# Patient Record
Sex: Male | Born: 1960
Health system: Southern US, Community
[De-identification: ages and names within clinical notes are randomized; demographics above are authoritative.]

## PROBLEM LIST (undated history)

## (undated) DIAGNOSIS — I10 Essential (primary) hypertension: Secondary | ICD-10-CM

## (undated) DIAGNOSIS — I2510 Atherosclerotic heart disease of native coronary artery without angina pectoris: Secondary | ICD-10-CM

## (undated) DIAGNOSIS — Z9989 Dependence on other enabling machines and devices: Secondary | ICD-10-CM

## (undated) DIAGNOSIS — I219 Acute myocardial infarction, unspecified: Secondary | ICD-10-CM

## (undated) DIAGNOSIS — G4733 Obstructive sleep apnea (adult) (pediatric): Secondary | ICD-10-CM

## (undated) DIAGNOSIS — E785 Hyperlipidemia, unspecified: Secondary | ICD-10-CM

## (undated) HISTORY — DX: Dependence on other enabling machines and devices: Z99.89

## (undated) HISTORY — DX: Atherosclerotic heart disease of native coronary artery without angina pectoris: I25.10

## (undated) HISTORY — DX: Acute myocardial infarction, unspecified: I21.9

## (undated) HISTORY — DX: Obstructive sleep apnea (adult) (pediatric): G47.33

## (undated) HISTORY — DX: Hyperlipidemia, unspecified: E78.5

## (undated) HISTORY — DX: Essential (primary) hypertension: I10

---
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
## 2010-01-13 DIAGNOSIS — I219 Acute myocardial infarction, unspecified: Secondary | ICD-10-CM

## 2010-01-13 HISTORY — DX: Acute myocardial infarction, unspecified: I21.9

## 2010-01-20 ENCOUNTER — Inpatient Hospital Stay (HOSPITAL_COMMUNITY): Admission: EM | Admit: 2010-01-20 | Discharge: 2010-01-23 | Payer: Self-pay | Admitting: Emergency Medicine

## 2010-01-20 ENCOUNTER — Ambulatory Visit: Payer: Self-pay | Admitting: Cardiovascular Disease

## 2010-01-20 ENCOUNTER — Encounter (INDEPENDENT_AMBULATORY_CARE_PROVIDER_SITE_OTHER): Payer: Self-pay | Admitting: Cardiology

## 2010-01-20 HISTORY — PX: US ECHOCARDIOGRAPHY: HXRAD669

## 2010-01-20 HISTORY — PX: CARDIAC CATHETERIZATION: SHX172

## 2010-05-18 ENCOUNTER — Ambulatory Visit
Admission: RE | Admit: 2010-05-18 | Discharge: 2010-05-18 | Disposition: A | Discharge status: Home / Self Care | Payer: BC Managed Care – PPO | Source: Ambulatory Visit | Attending: Physician Assistant | Admitting: Physician Assistant

## 2010-05-18 ENCOUNTER — Other Ambulatory Visit: Payer: Self-pay | Admitting: Physician Assistant

## 2010-05-18 DIAGNOSIS — J9859 Other diseases of mediastinum, not elsewhere classified: Secondary | ICD-10-CM

## 2010-05-18 MED ORDER — IOHEXOL 300 MG/ML  SOLN
75.0000 mL | Freq: Once | INTRAMUSCULAR | Status: AC | PRN
  Administered 2010-05-18: 75 mL via INTRAVENOUS

## 2010-05-22 HISTORY — PX: NM MYOCAR PERF WALL MOTION: HXRAD629

## 2010-06-28 LAB — COMPREHENSIVE METABOLIC PANEL
ALT: 39 U/L (ref 0–53)
AST: 38 U/L — ABNORMAL HIGH (ref 0–37)
AST: 85 U/L — ABNORMAL HIGH (ref 0–37)
Albumin: 4.1 g/dL (ref 3.5–5.2)
Alkaline Phosphatase: 73 U/L (ref 39–117)
BUN: 13 mg/dL (ref 6–23)
CO2: 23 mEq/L (ref 19–32)
CO2: 25 mEq/L (ref 19–32)
Chloride: 103 mEq/L (ref 96–112)
Chloride: 104 mEq/L (ref 96–112)
Creatinine, Ser: 0.97 mg/dL (ref 0.4–1.5)
GFR calc Af Amer: >60 mL/min (ref >60)
GFR calc non Af Amer: >60 mL/min (ref >60)
GFR calc non Af Amer: >60 mL/min (ref >60)
Potassium: 4.2 mEq/L (ref 3.5–5.1)
Total Bilirubin: 0.8 mg/dL (ref 0.3–1.2)
Total Bilirubin: 1.5 mg/dL — ABNORMAL HIGH (ref 0.3–1.2)

## 2010-06-28 LAB — GLUCOSE, CAPILLARY
Glucose-Capillary: 100 mg/dL — ABNORMAL HIGH (ref 70–99)
Glucose-Capillary: 103 mg/dL — ABNORMAL HIGH (ref 70–99)
Glucose-Capillary: 104 mg/dL — ABNORMAL HIGH (ref 70–99)
Glucose-Capillary: 104 mg/dL — ABNORMAL HIGH (ref 70–99)
Glucose-Capillary: 104 mg/dL — ABNORMAL HIGH (ref 70–99)
Glucose-Capillary: 113 mg/dL — ABNORMAL HIGH (ref 70–99)
Glucose-Capillary: 115 mg/dL — ABNORMAL HIGH (ref 70–99)
Glucose-Capillary: 128 mg/dL — ABNORMAL HIGH (ref 70–99)
Glucose-Capillary: 82 mg/dL (ref 70–99)
Glucose-Capillary: 97 mg/dL (ref 70–99)

## 2010-06-28 LAB — CARDIAC PANEL(CRET KIN+CKTOT+MB+TROPI)
CK, MB: 15.1 ng/mL (ref 0.3–4.0)
Relative Index: 22.5 — ABNORMAL HIGH (ref 0.0–2.5)
Relative Index: 7.7 — ABNORMAL HIGH (ref 0.0–2.5)
Total CK: 196 U/L (ref 7–232)
Total CK: 264 U/L — ABNORMAL HIGH (ref 7–232)
Total CK: 370 U/L — ABNORMAL HIGH (ref 7–232)
Troponin I: 10.15 ng/mL (ref 0.00–0.06)
Troponin I: 7.9 ng/mL (ref 0.00–0.06)

## 2010-06-28 LAB — CBC
HCT: 48.1 % (ref 39.0–52.0)
HCT: 48.8 % (ref 39.0–52.0)
HCT: 48.9 % (ref 39.0–52.0)
Hemoglobin: 16.6 g/dL (ref 13.0–17.0)
Hemoglobin: 17.3 g/dL — ABNORMAL HIGH (ref 13.0–17.0)
Hemoglobin: 17.7 g/dL — ABNORMAL HIGH (ref 13.0–17.0)
MCH: 32 pg (ref 26.0–34.0)
MCH: 32.8 pg (ref 26.0–34.0)
MCHC: 35.5 g/dL (ref 30.0–36.0)
MCV: 91.6 fL (ref 78.0–100.0)
MCV: 92.4 fL (ref 78.0–100.0)
MCV: 93.2 fL (ref 78.0–100.0)
Platelets: 237 10*3/uL (ref 150–400)
Platelets: 318 10*3/uL (ref 150–400)
RBC: 5.16 MIL/uL (ref 4.22–5.81)
RBC: 5.22 MIL/uL (ref 4.22–5.81)
RBC: 5.47 MIL/uL (ref 4.22–5.81)
WBC: 10.6 10*3/uL — ABNORMAL HIGH (ref 4.0–10.5)
WBC: 15.3 10*3/uL — ABNORMAL HIGH (ref 4.0–10.5)

## 2010-06-28 LAB — BASIC METABOLIC PANEL
BUN: 11 mg/dL (ref 6–23)
CO2: 24 mEq/L (ref 19–32)
Calcium: 8.8 mg/dL (ref 8.4–10.5)
Calcium: 8.9 mg/dL (ref 8.4–10.5)
Creatinine, Ser: 0.99 mg/dL (ref 0.4–1.5)
GFR calc Af Amer: >60 mL/min (ref >60)
GFR calc Af Amer: >60 mL/min (ref >60)
GFR calc non Af Amer: >60 mL/min (ref >60)
Potassium: 4 mEq/L (ref 3.5–5.1)
Sodium: 138 mEq/L (ref 135–145)

## 2010-06-28 LAB — DIFFERENTIAL
Basophils Absolute: 0 10*3/uL (ref 0.0–0.1)
Lymphocytes Relative: 10 % — ABNORMAL LOW (ref 12–46)
Lymphs Abs: 1.5 10*3/uL (ref 0.7–4.0)
Monocytes Absolute: 0.7 10*3/uL (ref 0.1–1.0)
Monocytes Relative: 4 % (ref 3–12)
Neutro Abs: 13 10*3/uL — ABNORMAL HIGH (ref 1.7–7.7)

## 2010-06-28 LAB — POCT CARDIAC MARKERS

## 2010-06-28 LAB — POCT I-STAT, CHEM 8
Calcium, Ion: 0.88 mmol/L — ABNORMAL LOW (ref 1.12–1.32)
Glucose, Bld: 168 mg/dL — ABNORMAL HIGH (ref 70–99)
HCT: 52 % (ref 39.0–52.0)
Hemoglobin: 17.7 g/dL — ABNORMAL HIGH (ref 13.0–17.0)
Potassium: 4.3 mEq/L (ref 3.5–5.1)

## 2010-06-28 LAB — URINALYSIS, ROUTINE W REFLEX MICROSCOPIC
Glucose, UA: NEGATIVE mg/dL
Hgb urine dipstick: NEGATIVE
Protein, ur: NEGATIVE mg/dL

## 2010-06-28 LAB — BRAIN NATRIURETIC PEPTIDE: Pro B Natriuretic peptide (BNP): 55 pg/mL (ref 0.0–100.0)

## 2010-06-28 LAB — PROTIME-INR
INR: 1.05 (ref 0.00–1.49)
Prothrombin Time: 13.9 seconds (ref 11.6–15.2)

## 2010-06-28 LAB — MAGNESIUM: Magnesium: 2 mg/dL (ref 1.5–2.5)

## 2010-06-28 LAB — TROPONIN I: Troponin I: 0.54 ng/mL (ref 0.00–0.06)

## 2010-06-28 LAB — APTT: aPTT: 30 seconds (ref 24–37)

## 2010-06-28 LAB — HEMOCCULT GUIAC POC 1CARD (OFFICE): Fecal Occult Bld: NEGATIVE

## 2010-07-19 ENCOUNTER — Encounter: Payer: Self-pay | Admitting: Pulmonary Disease

## 2010-07-19 ENCOUNTER — Ambulatory Visit (INDEPENDENT_AMBULATORY_CARE_PROVIDER_SITE_OTHER): Payer: BC Managed Care – PPO | Admitting: Pulmonary Disease

## 2010-07-19 VITALS — BP 130/88 | HR 92 | Temp 98.3°F | Ht 72.0 in | Wt 247.8 lb

## 2010-07-19 DIAGNOSIS — R0602 Shortness of breath: Secondary | ICD-10-CM

## 2010-07-19 DIAGNOSIS — R05 Cough: Secondary | ICD-10-CM

## 2010-07-19 NOTE — Patient Instructions (Addendum)
Trial of asmanex 1 puff daily Use albuterol inhaler only as needed Stay on reflux medication twice daily Take ZYRTEC during pollen season

## 2010-07-19 NOTE — Progress Notes (Signed)
  Subjective:    Patient ID: Adam Hunter, male    DOB: [birth year], 50 y.o.   MRN: [id number]  HPI 49/M ,ex smoker with COPD & severe OSA  He c/o dry cough worse in the mornings x 2 months , Got sick in feb when co worker got 'pna' 2 rounds of z-pak Then prednisone x 20 ds& inhaler Now c/o Am cough when he takes CPAP off  He suffered a Mild heart attack in oct' 11, on CPAP since dec'11 (Dr Tresa Endo) - on cpap 17 cm, quit smoking since oct'11 He had a negative stress test  This year - a good prognostic indicator. Spirometry nml  Ct chest has shown centrilobular emphysema in both upper lobes & mild mediastinal Lns that was stable comapred to CT from 01/21/10 s/o benign process.    Review of Systems  Constitutional: Negative for fever, appetite change and unexpected weight change.  HENT: Positive for postnasal drip. Negative for ear pain, sore throat, rhinorrhea, sneezing, trouble swallowing and dental problem.   Eyes: Positive for redness.  Respiratory: Positive for cough and shortness of breath. Negative for wheezing.   Cardiovascular: Positive for chest pain. Negative for palpitations and leg swelling.  Gastrointestinal: Positive for vomiting. Negative for nausea, abdominal pain and diarrhea.  Genitourinary: Negative for dysuria and urgency.  Musculoskeletal: Positive for joint swelling.  Skin: Negative for rash.  Neurological: Positive for headaches. Negative for syncope.  Hematological: Does not bruise/bleed easily.  Psychiatric/Behavioral: Negative for dysphoric mood. The patient is nervous/anxious.        Objective:   Physical Exam Gen. Pleasant, well-nourished, in no distress, normal affect ENT - no lesions, no post nasal drip, class 3 airway Neck: No JVD, no thyromegaly, no carotid bruits Lungs: no use of accessory muscles, no dullness to percussion, clear without rales or rhonchi  Cardiovascular: Rhythm regular, heart sounds  normal, no murmurs or gallops, no peripheral  edema Abdomen: soft and non-tender, no hepatosplenomegaly, BS normal. Musculoskeletal: No deformities, no cyanosis or clubbing Neuro:  alert, non focal         Assessment & Plan:

## 2010-07-22 ENCOUNTER — Encounter: Payer: Self-pay | Admitting: Pulmonary Disease

## 2010-07-22 DIAGNOSIS — R05 Cough: Secondary | ICD-10-CM | POA: Insufficient documentation

## 2010-07-22 DIAGNOSIS — R059 Cough, unspecified: Secondary | ICD-10-CM | POA: Insufficient documentation

## 2010-07-22 NOTE — Assessment & Plan Note (Signed)
Unclear etiology, but common causes include upper airway cough -sinus disease, allergies, GERd - doubt cough variant asthma. Airway obstruction does not seem bad inspite of 30 Py history of smoking Trial of asmanex 1 puff daily Use albuterol inhaler only as needed Stay on reflux medication twice daily Take ZYRTEC during pollen season

## 2010-09-03 ENCOUNTER — Ambulatory Visit: Payer: BC Managed Care – PPO | Admitting: Pulmonary Disease

## 2012-04-09 ENCOUNTER — Emergency Department: Payer: Self-pay | Admitting: Emergency Medicine

## 2012-04-09 LAB — CBC
HCT: 45.2 % (ref 40.0–52.0)
HGB: 15.2 g/dL (ref 13.0–18.0)
MCH: 29.9 pg (ref 26.0–34.0)
MCHC: 33.6 g/dL (ref 32.0–36.0)
Platelet: 250 10*3/uL (ref 150–440)
RDW: 13.9 % (ref 11.5–14.5)
WBC: 9.2 10*3/uL (ref 3.8–10.6)

## 2012-04-09 LAB — CK TOTAL AND CKMB (NOT AT ARMC): CK, Total: 110 U/L (ref 35–232)

## 2012-04-09 LAB — BASIC METABOLIC PANEL
Calcium, Total: 8.8 mg/dL (ref 8.5–10.1)
Chloride: 107 mmol/L (ref 98–107)
Co2: 26 mmol/L (ref 21–32)
Creatinine: 1.03 mg/dL (ref 0.60–1.30)
EGFR (Non-African Amer.): >60
Osmolality: 284 (ref 275–301)
Potassium: 3.5 mmol/L (ref 3.5–5.1)
Sodium: 140 mmol/L (ref 136–145)

## 2012-04-09 LAB — TROPONIN I
Troponin-I: <0.02 ng/mL
Troponin-I: <0.02 ng/mL

## 2012-08-24 IMAGING — CT CT ANGIO CHEST
2 of 6 series · 19 of 36 positions shown · IV contrast (APPLIED)
Comparison: None.

CLINICAL DATA: Chest pain with elevated D-dimer.

CT ANGIOGRAPHY CHEST WITH CONTRAST
TECHNIQUE: Multidetector CT imaging of the chest was performed
using the standard protocol during bolus administration of
intravenous contrast.  Multiplanar CT image reconstructions
including MIPs were obtained to evaluate the vascular anatomy.
Contrast:  110 ml Rmnipaque-HDD

[Series 8: pulm embolism 1.0 b25f thins · axial · 0.70mm/px · z∈[-272,-46]mm · 18 of 252 slices shown]
[im 13/252  lung]
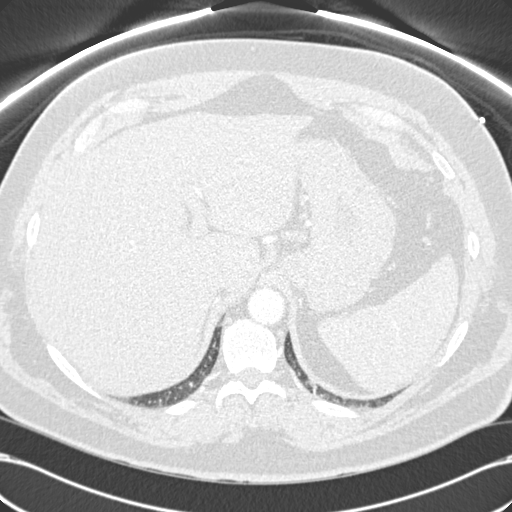
[im 26/252  mediastinal]
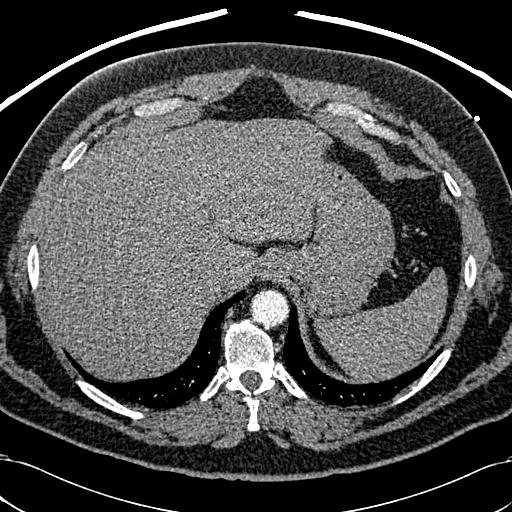
[im 38/252  lung]
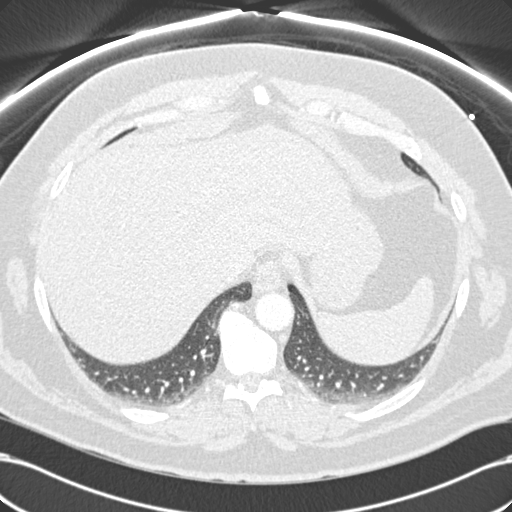
[im 51/252  mediastinal]
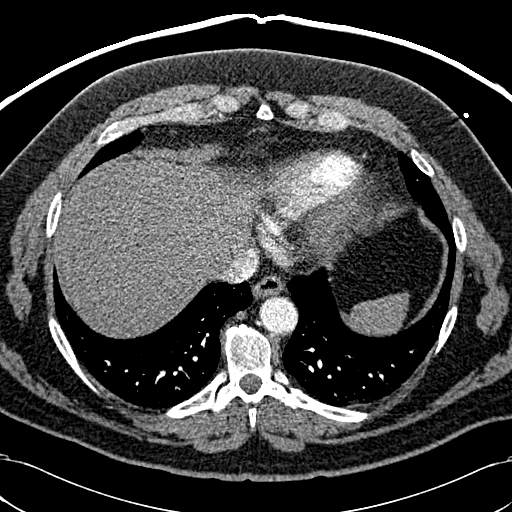
[im 63/252  lung]
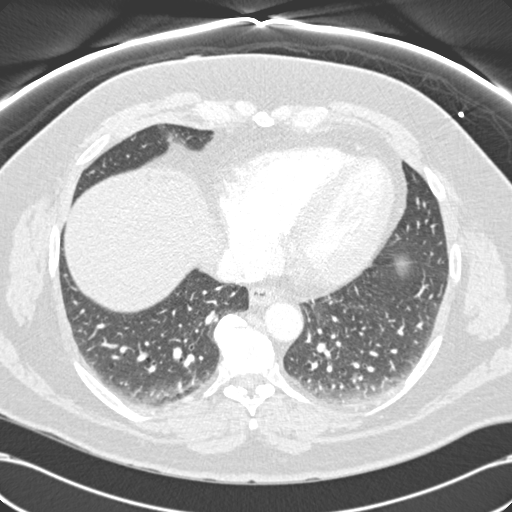
[im 76/252  mediastinal]
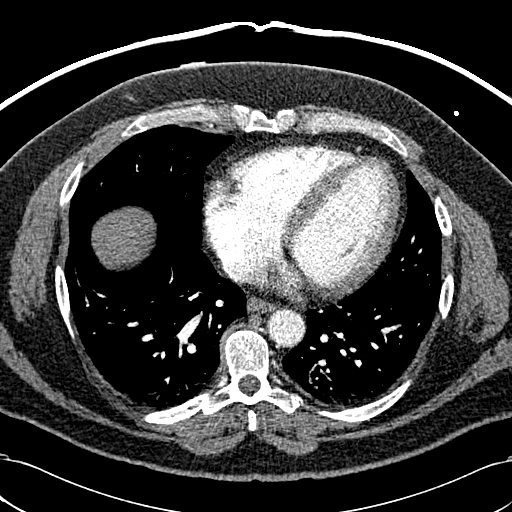
[im 88/252  lung]
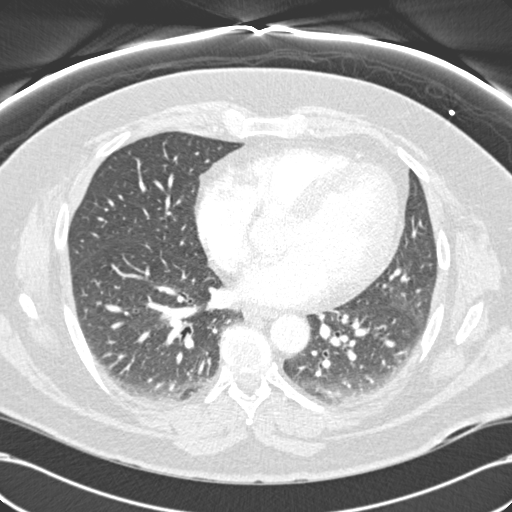
[im 101/252  mediastinal]
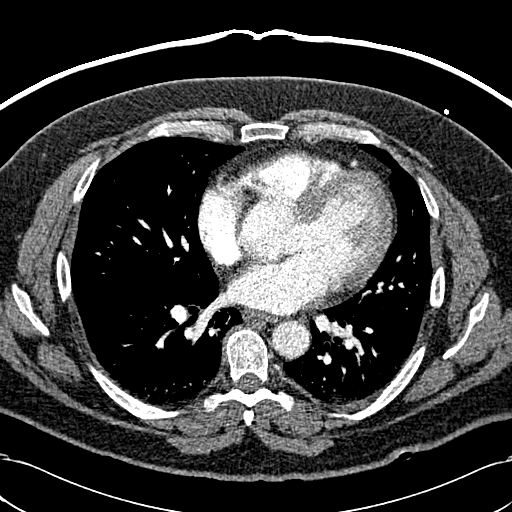
[im 113/252  lung]
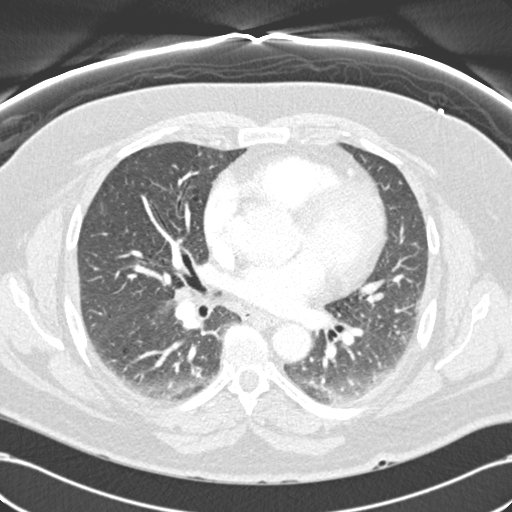
[im 139/252  mediastinal]
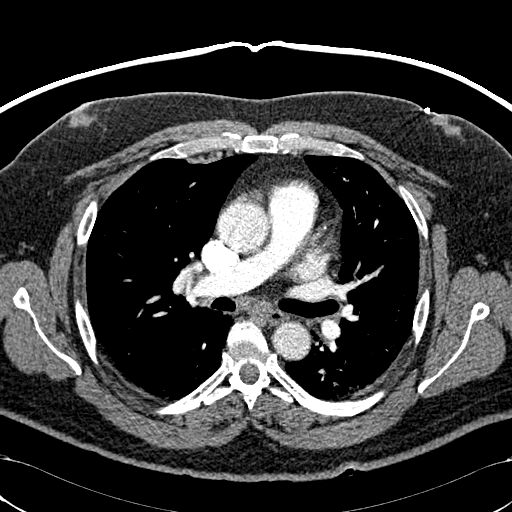
[im 151/252  lung]
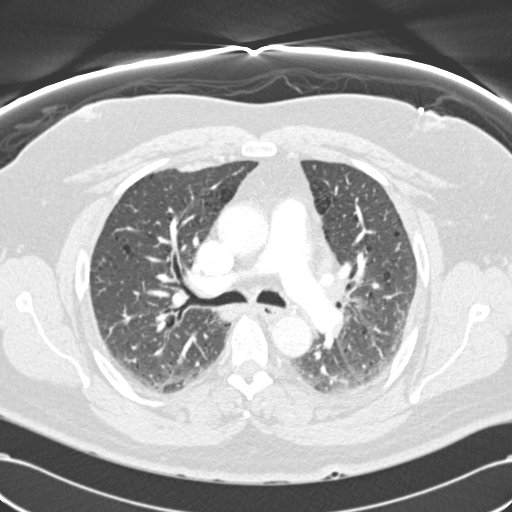
[im 164/252  mediastinal]
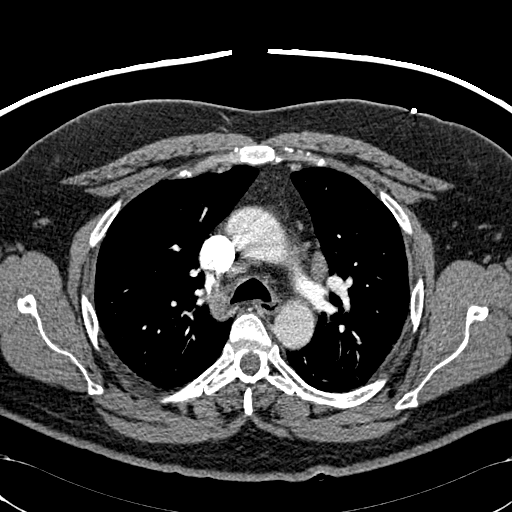
[im 176/252  lung]
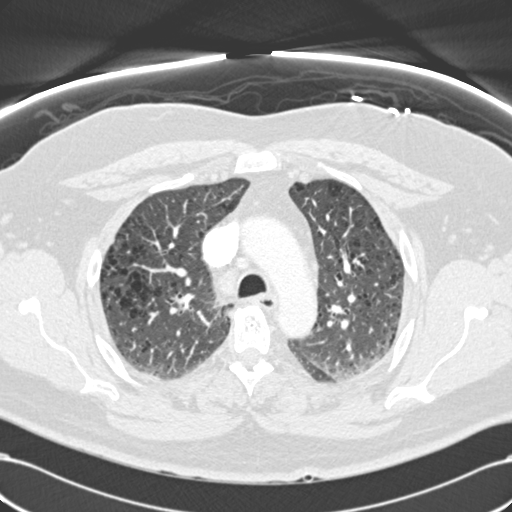
[im 189/252  mediastinal]
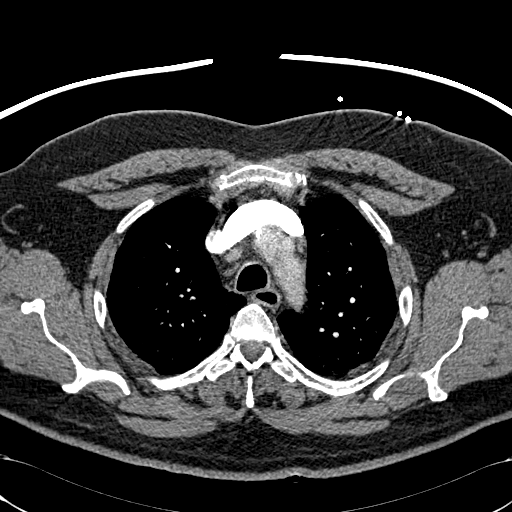
[im 201/252  lung]
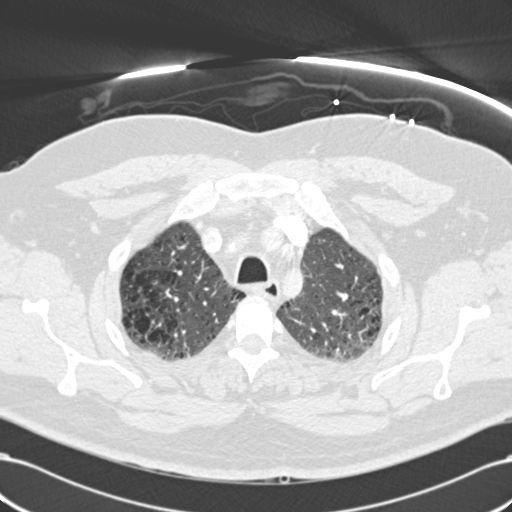
[im 214/252  mediastinal]
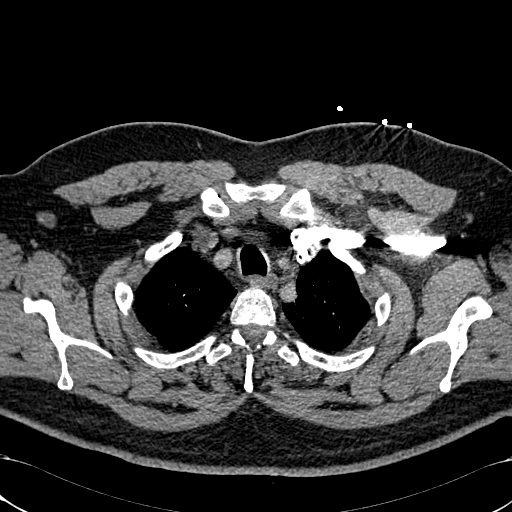
[im 226/252  lung]
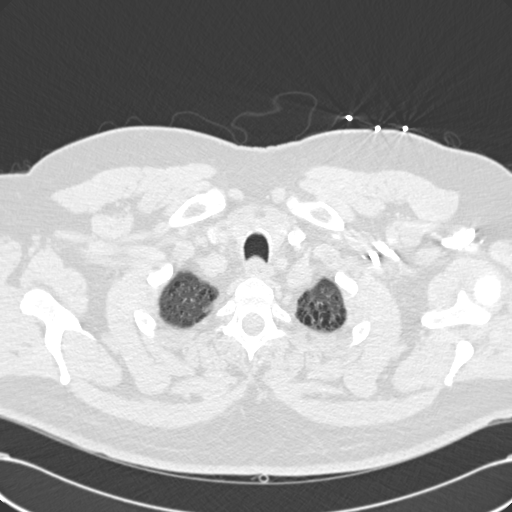
[im 239/252  mediastinal]
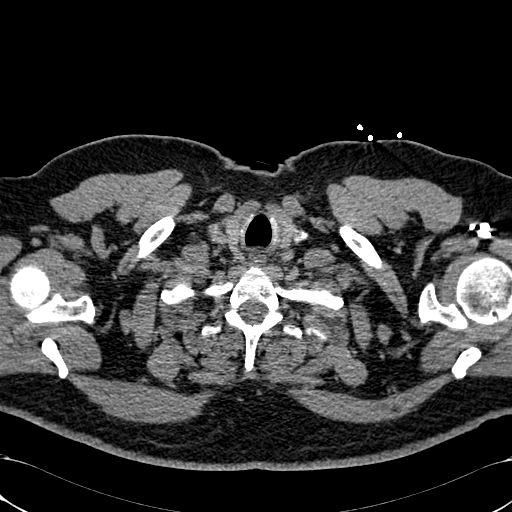

[Series 602: coronal mpr · coronal · 0.70mm/px · 1 of 116 slices shown]
[im 58/116  mediastinal]
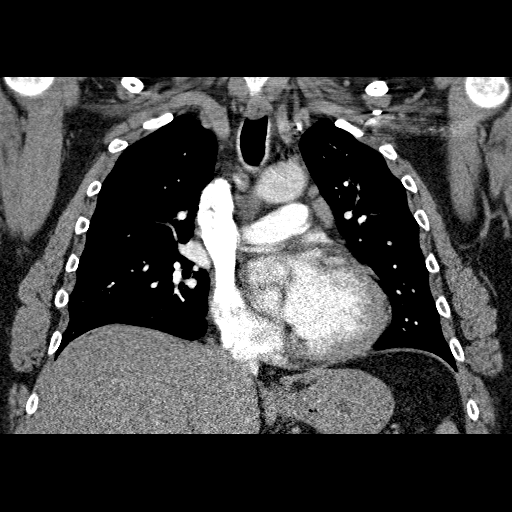

[19 of 36 positions shown; findings below may reference images not displayed]

FINDINGS: There is no filling defect in the opacified pulmonary
arteries to suggest the presence of an acute pulmonary embolus.
The no thoracic aortic aneurysm.  No evidence for a dissection
within the thoracic aorta.  Heart size is normal.  No pericardial
or pleural effusion.

No axillary lymphadenopathy.  11 mm short-axis AP window lymph node
is associated with an 11 mm short-axis right paratracheal lymph
node and a 16 mm short-axis right hilar lymph node.  Borderline
lymphadenopathy is seen in the left hilum.

Lung windows show bilateral upper lobe emphysema.  There is some
compressive atelectasis in the lower lobes.  No focal airspace
consolidation.  No parenchymal nodule or mass.

Review of the MIP images confirms the above findings.
IMPRESSION: No CT evidence for acute pulmonary embolus.

Mild mediastinal and right hilar lymphadenopathy.  This is of
indeterminate etiology may be reactive.  Consider follow-up CT
chest in 3 months to insure stability.

Upper lobe emphysema bilaterally.

## 2012-09-23 ENCOUNTER — Other Ambulatory Visit: Payer: Self-pay | Admitting: Cardiovascular Disease

## 2012-11-06 ENCOUNTER — Telehealth: Payer: Self-pay | Admitting: Cardiovascular Disease

## 2012-11-06 ENCOUNTER — Other Ambulatory Visit: Payer: Self-pay | Admitting: *Deleted

## 2012-11-06 MED ORDER — LANSOPRAZOLE 30 MG PO CPDR: 30.0000 mg | DELAYED_RELEASE_CAPSULE | Freq: Every day | ORAL | Status: DC

## 2012-11-06 NOTE — Telephone Encounter (Signed)
Please call the insurance company to authorize the prescription at this number ... [phone] or can fax the number [phone]  Thanks

## 2012-11-06 NOTE — Telephone Encounter (Signed)
Mrs. Heinbaugh would like to know if we received the Maura Crandall for Mr. Keady's  Lansprazole from [employer].

## 2012-11-06 NOTE — Telephone Encounter (Signed)
Rx was sent to pharmacy electronically. 

## 2012-11-09 NOTE — Telephone Encounter (Signed)
Need authorization for Lansoprazole-Call to [phone]

## 2012-11-10 ENCOUNTER — Other Ambulatory Visit: Payer: Self-pay | Admitting: Cardiovascular Disease

## 2012-11-10 ENCOUNTER — Telehealth: Payer: Self-pay | Admitting: Cardiovascular Disease

## 2012-11-10 LAB — COMPREHENSIVE METABOLIC PANEL
ALT: 35 U/L (ref 0–53)
AST: 27 U/L (ref 0–37)
Albumin: 4.1 g/dL (ref 3.5–5.2)
Alkaline Phosphatase: 73 U/L (ref 39–117)
BUN: 13 mg/dL (ref 6–23)
Potassium: 3.9 mEq/L (ref 3.5–5.3)
Sodium: 140 mEq/L (ref 135–145)

## 2012-11-10 LAB — LIPID PANEL
HDL: 26 mg/dL — ABNORMAL LOW (ref >39)
LDL Cholesterol: 65 mg/dL (ref 0–99)

## 2012-11-10 NOTE — Telephone Encounter (Signed)
Calling to see if Dr.Croitoru has pre-authorized Adam Hunter medication (Lanspazole) before the insurance will pay for it .[name] Can call the insurance company at this number .[name]    Thanks

## 2012-11-10 NOTE — Telephone Encounter (Signed)
Pharmacy and patient notified Prevacid has been authorized x 6 months.  Message left for patient.

## 2012-11-21 ENCOUNTER — Encounter: Payer: Self-pay | Admitting: *Deleted

## 2012-11-24 ENCOUNTER — Encounter: Payer: Self-pay | Admitting: Cardiovascular Disease

## 2012-11-25 ENCOUNTER — Encounter: Payer: Self-pay | Admitting: Cardiovascular Disease

## 2012-11-25 ENCOUNTER — Ambulatory Visit (INDEPENDENT_AMBULATORY_CARE_PROVIDER_SITE_OTHER): Payer: BC Managed Care – PPO | Admitting: Cardiovascular Disease

## 2012-11-25 VITALS — BP 152/98 | HR 58 | Ht 72.0 in | Wt 261.7 lb

## 2012-11-25 DIAGNOSIS — E785 Hyperlipidemia, unspecified: Secondary | ICD-10-CM | POA: Insufficient documentation

## 2012-11-25 DIAGNOSIS — I2510 Atherosclerotic heart disease of native coronary artery without angina pectoris: Secondary | ICD-10-CM

## 2012-11-25 HISTORY — DX: Atherosclerotic heart disease of native coronary artery without angina pectoris: I25.10

## 2012-11-25 HISTORY — DX: Hyperlipidemia, unspecified: E78.5

## 2012-11-25 NOTE — Assessment & Plan Note (Signed)
This remains Adam Hunter's biggest problem and I told him in no uncertain terms that this is a big threats to his health and longevity. His efforts at weight loss need to be persistent. He can now that the holidays set him back. He needs to lose a lot of weight and this will only be achievable with a very concentrated effort. He needs to continue exercising. I have advised him to carry some light 5-10 pound dumbbells when he goes on his walks. This will help him burn some extra calories. I think the biggest problem however is his diet and his inability to restrain himself from large portions and excessive carbohydrates. I reminded him that he is at risk for developing diabetes mellitus. He started having problems with coronary disease a remarkably young age, and unless he pays more attention to his lifestyle it is only a question time until he has new complications.

## 2012-11-25 NOTE — Patient Instructions (Signed)
Your physician encouraged you to lose weight for better health.  Your physician discussed the importance of regular exercise and recommended that you start or continue a regular exercise program for good health.  Your physician recommends that you schedule a follow-up appointment in: 6 months

## 2012-11-25 NOTE — Progress Notes (Signed)
Patient ID: Adam Hunter, male   DOB: 1961-01-16, 52 y.o.   MRN: [id number]     Reason for office visit CAD  Adam Hunter feels well. He continues to exercise on a treadmill or an inside track walking 2 miles in about 30 minutes. He breaks into a light sweat when he does this. He denies any chest pain or shortness of breath. Unfortunately he has actually backed tract rather than make progress on his weight. He has gained about 3 pounds since his previous appointment. He blames this on his recent vacation when he was very inactive and ate and drank more alcohol than usual.    No Known Allergies  Current Outpatient Prescriptions  Medication Sig Dispense Refill  . aspirin 325 MG EC tablet Take 325 mg by mouth daily.        [name] atorvastatin (LIPITOR) 10 MG tablet Take 10 mg by mouth daily.      . lansoprazole (PREVACID) 30 MG capsule Take 1 capsule (30 mg total) by mouth daily.  30 capsule  6  . losartan (COZAAR) 100 MG tablet TAKE 1 TABLET BY MOUTH ONCE DAILY  30 tablet  5  . metoprolol (TOPROL-XL) 50 MG 24 hr tablet Take 1 tablet by mouth Daily.      . niacin 500 MG tablet Take 500 mg by mouth daily with breakfast.        . PLAVIX 75 MG tablet Take 1 capsule by mouth Daily.       No current facility-administered medications for this visit.    Past Medical History  Diagnosis Date  . Hypertension   . Heart attack 01/2010    non-STEMI  . Emphysema   . Hyperlipidemia   . CAD (coronary artery disease) 11/25/2012    STEMI October 2011 secondary to occlusion of a large septal branch of the LAD artery treated medically  . Hyperlipidemia 11/25/2012    Past Surgical History  Procedure Laterality Date  . Cardiac catheterization  01/20/2010    Diffuse coronary atherosclerosis w/coronary ectasia,totalled first septal perforator. non-obstructive LAD/diagonal,left CX and RCA stenosis  . US echocardiography  01/20/2010    mod. LVH,grade I diastolic dysfunction,mild AI,MR,MV annular ca+  . Nm myocar perf  wall motion  05/22/2010    normal,EF 53%.  . Finger surgery  1974    Family History  Problem Relation Age of Onset  . Heart disease Father   . Cancer Maternal Grandfather   . Hypertension Mother   . Hypertension Father   . Heart attack Father     History   Social History  . Marital Status: Married    Spouse Name: N/A    Number of Children: N/A  . Years of Education: N/A   Occupational History  . Not on file.   Social History Main Topics  . Smoking status: Former Smoker -- 1.50 packs/day for 30 years    Quit date: 01/22/2010  . Smokeless tobacco: Not on file  . Alcohol Use: Yes     Comment: social  . Drug Use: No  . Sexual Activity: Not on file   Other Topics Concern  . 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  The patient also denies abdominal pain, nausea, vomiting, dysphagia, diarrhea, constipation, polyuria, polydipsia, dysuria, hematuria, frequency, urgency, abnormal bleeding or bruising, fever, chills, unexpected weight changes, mood swings, change in skin or hair texture, change in voice quality, auditory or visual problems, allergic reactions or rashes, new musculoskeletal complaints other than usual "aches and pains".   PHYSICAL EXAM BP 152/98  Pulse 58  Ht 6' (1.829 m)  Wt 261 lb 11.2 oz (118.706 kg)  BMI 35.49 kg/m2  General: Alert, oriented x3, no distress, moderately obese with a very protuberant abdomen Head: no evidence of trauma, PERRL, EOMI, no exophtalmos or lid lag, no myxedema, no xanthelasma; normal ears, nose and oropharynx Neck: normal jugular venous  pulsations and no hepatojugular reflux; brisk carotid pulses without delay and no carotid bruits Chest: clear to auscultation, no signs of consolidation by percussion or palpation, normal fremitus, symmetrical and full respiratory excursions Cardiovascular: normal position and quality of the apical impulse, regular rhythm, normal first and second heart sounds, no murmurs, rubs or gallops Abdomen: no tenderness or distention, no masses by palpation, no abnormal pulsatility or arterial bruits, normal bowel sounds, no hepatosplenomegaly Extremities: no clubbing, cyanosis or edema; 2+ radial, ulnar and brachial pulses bilaterally; 2+ right femoral, posterior tibial and dorsalis pedis pulses; 2+ left femoral, posterior tibial and dorsalis pedis pulses; no subclavian or femoral bruits Neurological: grossly nonfocal   EKG: NSR  Lipid Panel     Component Value Date/Time   CHOL 124 11/10/2012 0854   TRIG 164* 11/10/2012 0854   HDL 26* 11/10/2012 0854   CHOLHDL 4.8 11/10/2012 0854   VLDL 33 11/10/2012 0854   LDLCALC 65 11/10/2012 0854    BMET    Component Value Date/Time   NA 140 11/10/2012 0854   K 3.9 11/10/2012 0854   CL 103 11/10/2012 0854   CO2 29 11/10/2012 0854   GLUCOSE 95 11/10/2012 0854   BUN 13 11/10/2012 0854   CREATININE 0.98 11/10/2012 0854   CREATININE 0.95 01/22/2010 0430   CALCIUM 9.1 11/10/2012 0854   GFRNONAA >60 01/22/2010 0430   GFRAA  Value: >60        The eGFR has been calculated using the MDRD equation. This calculation has not been validated in all clinical situations. eGFR's persistently <60 mL/min signify possible Chronic Kidney Disease. 01/22/2010 0430     ASSESSMENT AND PLAN Morbid obesity This remains Adam Hunter's biggest problem and I told him in no uncertain terms that this is a big threats to his health and longevity. His efforts at weight loss need to be persistent. He can now that the holidays set him back. He needs to lose a lot of weight and this will only be achievable  with a very concentrated effort. He needs to continue exercising. I have advised him to carry some light 5-10 pound dumbbells when he goes on his walks. This will help him burn some extra calories. I think the biggest problem however is his diet and his inability to restrain himself from large portions and excessive carbohydrates. I reminded him that he is at risk for developing diabetes mellitus. He started having problems with coronary disease a remarkably young age, and unless he pays more attention to his lifestyle it is only a question time until he has new complications.  CAD (coronary artery disease) He presented in 2011 with a small non-ST segment elevation myocardial infarction and was found to have total occlusion of a proximal septal artery branch of the LAD and  only mild scattered lesions in the other coronaries. Remains asymptomatic despite being reasonably active. He does not smoke. He takes an active statin. His blood pressure is well-controlled. He did gain a lot of weight after he quit smoking. He has borderline diabetes mellitus (hemoglobin A1c 5.9% without therapy).  Hyperlipidemia    Orders Placed This Encounter  Procedures  . EKG 12-Lead    Adam Hunter  Thurmon Fair, MD, [facility] [phone] office [phone] pager

## 2012-11-25 NOTE — Assessment & Plan Note (Signed)
He presented in 2011 with a small non-ST segment elevation myocardial infarction and was found to have total occlusion of a proximal septal artery branch of the LAD and only mild scattered lesions in the other coronaries. Remains asymptomatic despite being reasonably active. He does not smoke. He takes an active statin. His blood pressure is well-controlled. He did gain a lot of weight after he quit smoking. He has borderline diabetes mellitus (hemoglobin A1c 5.9% without therapy).

## 2012-12-31 ENCOUNTER — Other Ambulatory Visit: Payer: Self-pay

## 2012-12-31 MED ORDER — CLOPIDOGREL BISULFATE 75 MG PO TABS: 75.0000 mg | ORAL_TABLET | Freq: Every day | ORAL | Status: DC

## 2012-12-31 MED ORDER — METOPROLOL SUCCINATE ER 50 MG PO TB24: 50.0000 mg | ORAL_TABLET | Freq: Every day | ORAL | Status: DC

## 2012-12-31 NOTE — Telephone Encounter (Signed)
Rx faxed to pharmacy  

## 2013-03-25 ENCOUNTER — Other Ambulatory Visit: Payer: Self-pay | Admitting: Cardiovascular Disease

## 2013-03-25 NOTE — Telephone Encounter (Signed)
Rx was sent to pharmacy electronically. 

## 2013-06-01 ENCOUNTER — Other Ambulatory Visit: Payer: Self-pay | Admitting: Cardiovascular Disease

## 2013-06-02 NOTE — Telephone Encounter (Signed)
Rx was sent to pharmacy electronically. 

## 2013-06-11 ENCOUNTER — Other Ambulatory Visit: Payer: Self-pay

## 2013-06-18 NOTE — Telephone Encounter (Signed)
Prior Authorization request faxed to insurance company. Awaiting approval.

## 2013-06-24 ENCOUNTER — Ambulatory Visit (INDEPENDENT_AMBULATORY_CARE_PROVIDER_SITE_OTHER): Payer: BC Managed Care – PPO | Admitting: Cardiovascular Disease

## 2013-06-24 ENCOUNTER — Ambulatory Visit: Payer: BC Managed Care – PPO | Admitting: Cardiovascular Disease

## 2013-06-24 ENCOUNTER — Telehealth: Payer: Self-pay | Admitting: *Deleted

## 2013-06-24 ENCOUNTER — Encounter: Payer: Self-pay | Admitting: Cardiovascular Disease

## 2013-06-24 VITALS — BP 138/89 | HR 61 | Resp 16 | Ht 72.0 in | Wt 264.8 lb

## 2013-06-24 DIAGNOSIS — Z79899 Other long term (current) drug therapy: Secondary | ICD-10-CM

## 2013-06-24 DIAGNOSIS — I2510 Atherosclerotic heart disease of native coronary artery without angina pectoris: Secondary | ICD-10-CM

## 2013-06-24 NOTE — Patient Instructions (Signed)
Your physician recommends that you return for lab work when you are fasting.  Your physician encouraged you to lose weight for better health.  Your physician recommends that you schedule a follow-up appointment in: 12 months.

## 2013-06-24 NOTE — Telephone Encounter (Signed)
Approval for Lansoprazole DR 30mg  06/18/13-06/19/14.  Patient notified.

## 2013-06-25 ENCOUNTER — Ambulatory Visit: Payer: BC Managed Care – PPO | Admitting: Cardiovascular Disease

## 2013-06-27 ENCOUNTER — Encounter: Payer: Self-pay | Admitting: Cardiovascular Disease

## 2013-06-27 NOTE — Progress Notes (Signed)
Patient ID: Adam Hunter, male   DOB: 28-Jun-1960, 53 y.o.   MRN: [id number]     Reason for office visit CAD  Adam Hunter presented in 2011 with a small non-ST segment elevation myocardial infarction and was found to have total occlusion of a proximal septal artery branch of the LAD and only mild scattered lesions in the other coronaries. Remains asymptomatic despite being reasonably active. He does not smoke. He takes an active statin. His blood pressure is well-controlled. He did gain a lot of weight after he quit smoking. He has borderline diabetes mellitus (hemoglobin A1c 5.9% without therapy).  He has not had angina pectoris since his myocardial infarction. Unfortunately he continues to gain weight and is now moderate to severely obese with a BMI of 36. He has added responsibilities at work. Dissection makes him walk more from one section of the plans to another but he has developed some pain "like a stone bruise" in his right heel, probably plantar fasciitis. Has no cardiac symptoms    No Known Allergies  Current Outpatient Prescriptions  Medication Sig Dispense Refill  . aspirin 325 MG EC tablet Take 325 mg by mouth daily.        [name] atorvastatin (LIPITOR) 10 MG tablet TAKE ONE (1) TABLET BY MOUTH AT BEDTIME  30 tablet  6  . clopidogrel (PLAVIX) 75 MG tablet Take 1 tablet (75 mg total) by mouth daily.  30 tablet  3  . lansoprazole (PREVACID) 30 MG capsule Take 1 capsule (30 mg total) by mouth daily.  30 capsule  6  . losartan (COZAAR) 100 MG tablet TAKE 1 TABLET BY MOUTH ONCE DAILY  30 tablet  8  . metoprolol succinate (TOPROL-XL) 50 MG 24 hr tablet Take 1 tablet (50 mg total) by mouth daily.  30 tablet  3  . niacin 500 MG tablet Take 500 mg by mouth daily with breakfast.         No current facility-administered medications for this visit.    Past Medical History  Diagnosis Date  . Hypertension   . Heart attack 01/2010    non-STEMI  . Emphysema   . Hyperlipidemia   . CAD (coronary artery  disease) 11/25/2012    STEMI October 2011 secondary to occlusion of a large septal branch of the LAD artery treated medically  . Hyperlipidemia 11/25/2012  . OSA on CPAP     Past Surgical History  Procedure Laterality Date  . Cardiac catheterization  01/20/2010    Diffuse coronary atherosclerosis w/coronary ectasia,totalled first septal perforator. non-obstructive LAD/diagonal,left CX and RCA stenosis  . US echocardiography  01/20/2010    mod. LVH,grade I diastolic dysfunction,mild AI,MR,MV annular ca+  . Nm myocar perf wall motion  05/22/2010    normal,EF 53%.  . Finger surgery  1974    Family History  Problem Relation Age of Onset  . Heart disease Father   . Cancer Maternal Grandfather   . Hypertension Mother   . Hypertension Father   . Heart attack Father     History   Social History  . Marital Status: Married    Spouse Name: N/A    Number of Children: N/A  . Years of Education: N/A   Occupational History  . Not on file.   Social History Main Topics  . Smoking status: Former Smoker -- 1.50 packs/day for 30 years    Quit date: 01/22/2010  . Smokeless tobacco: Not on file  . Alcohol Use: Yes     Comment: social  .  Drug Use: No  . Sexual Activity: Not on file   Other Topics Concern  . 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  The patient also denies abdominal pain, nausea, vomiting, dysphagia, diarrhea, constipation, polyuria, polydipsia, dysuria, hematuria, frequency, urgency, abnormal bleeding or bruising, fever, chills, unexpected weight changes, mood swings, change in skin or  hair texture, change in voice quality, auditory or visual problems, allergic reactions or rashes, new musculoskeletal complaints other than usual "aches and pains".   PHYSICAL EXAM BP 138/89  Pulse 61  Resp 16  Ht 6' (1.829 m)  Wt 120.112 kg (264 lb 12.8 oz)  BMI 35.91 kg/m2  General: Alert, oriented x3, no distress Head: no evidence of trauma, PERRL, EOMI, no exophtalmos or lid lag, no myxedema, no xanthelasma; normal ears, nose and oropharynx Neck: normal jugular venous pulsations and no hepatojugular reflux; brisk carotid pulses without delay and no carotid bruits Chest: clear to auscultation, no signs of consolidation by percussion or palpation, normal fremitus, symmetrical and full respiratory excursions Cardiovascular: normal position and quality of the apical impulse, regular rhythm, normal first and second heart sounds, no murmurs, rubs or gallops Abdomen: no tenderness or distention, no masses by palpation, no abnormal pulsatility or arterial bruits, normal bowel sounds, no hepatosplenomegaly Extremities: no clubbing, cyanosis or edema; 2+ radial, ulnar and brachial pulses bilaterally; 2+ right femoral, posterior tibial and dorsalis pedis pulses; 2+ left femoral, posterior tibial and dorsalis pedis pulses; no subclavian or femoral bruits Neurological: grossly nonfocal   EKG: NSR  Lipid Panel     Component Value Date/Time   CHOL 124 11/10/2012 0854   TRIG 164* 11/10/2012 0854   HDL 26* 11/10/2012 0854   CHOLHDL 4.8 11/10/2012 0854   VLDL 33 11/10/2012 0854   LDLCALC 65 11/10/2012 0854    BMET    Component Value Date/Time   NA 140 11/10/2012 0854   K 3.9 11/10/2012 0854   CL 103 11/10/2012 0854   CO2 29 11/10/2012 0854   GLUCOSE 95 11/10/2012 0854   BUN 13 11/10/2012 0854   CREATININE 0.98 11/10/2012 0854   CREATININE 0.95 01/22/2010 0430   CALCIUM 9.1 11/10/2012 0854   GFRNONAA >60 01/22/2010 0430   GFRAA  Value: >60        The eGFR has been calculated using the MDRD  equation. This calculation has not been validated in all clinical situations. eGFR's persistently <60 mL/min signify possible Chronic Kidney Disease. 01/22/2010 0430     ASSESSMENT AND PLAN CAD (coronary artery disease) Asymptomatic. With the exception of mild hypertriglyceridemia and low HDL cholesterol, all his coronary risk factors are well addressed. His metabolic profile will not improve further without weight loss. We have discussed this at length in the past we did so again today. Recommended a low carbohydrate, high protein high in saturated fat diet. He should try to eat a protein rich meal early in the day and limit intake of food at dinner. Continued walking is important and he may have to see someone for plantar fasciitis. He is trying some new inserts this week.   Orders Placed This Encounter  Procedures  . Comp Met (CMET)  .  Lipid panel  . EKG 12-Lead   No orders of the defined types were placed in this encounter.    Holli Humbles, MD, [place] [phone] office [phone] pager

## 2013-06-27 NOTE — Assessment & Plan Note (Signed)
Asymptomatic. With the exception of mild hypertriglyceridemia and low HDL cholesterol, all his coronary risk factors are well addressed. His metabolic profile will not improve further without weight loss. We have discussed this at length in the past we did so again today. Recommended a low carbohydrate, high protein high in saturated fat diet. He should try to eat a protein rich meal early in the day and limit intake of food at dinner. Continued walking is important and he may have to see someone for plantar fasciitis. He is trying some new inserts this week.

## 2013-07-07 ENCOUNTER — Other Ambulatory Visit: Payer: Self-pay | Admitting: Cardiovascular Disease

## 2013-07-07 MED ORDER — LANSOPRAZOLE 30 MG PO CPDR: 30.0000 mg | DELAYED_RELEASE_CAPSULE | Freq: Every day | ORAL | Status: DC

## 2013-07-07 NOTE — Telephone Encounter (Addendum)
Rx was sent to pharmacy electronically. Please ignore refusal.

## 2013-07-07 NOTE — Addendum Note (Signed)
Addended by: Neta EhlersRUITT, ANGELA M on: 07/07/2013 06:08 PM   Modules accepted: Orders

## 2013-08-24 ENCOUNTER — Other Ambulatory Visit: Payer: Self-pay | Admitting: Cardiovascular Disease

## 2013-08-24 NOTE — Telephone Encounter (Signed)
Rx was sent to pharmacy electronically. 

## 2013-09-01 ENCOUNTER — Other Ambulatory Visit: Payer: Self-pay | Admitting: Cardiovascular Disease

## 2013-09-01 NOTE — Telephone Encounter (Signed)
Rx refill sent to patient pharmacy   

## 2013-09-25 LAB — LIPID PANEL
Cholesterol: 116 mg/dL (ref 0–200)
HDL: 25 mg/dL — ABNORMAL LOW (ref >39)
LDL CALC: 65 mg/dL (ref 0–99)
Total CHOL/HDL Ratio: 4.6 Ratio
Triglycerides: 128 mg/dL (ref <150)
VLDL: 26 mg/dL (ref 0–40)

## 2013-09-25 LAB — COMPREHENSIVE METABOLIC PANEL
ALBUMIN: 4.2 g/dL (ref 3.5–5.2)
ALK PHOS: 79 U/L (ref 39–117)
ALT: 34 U/L (ref 0–53)
AST: 25 U/L (ref 0–37)
BILIRUBIN TOTAL: 0.6 mg/dL (ref 0.2–1.2)
BUN: 17 mg/dL (ref 6–23)
CO2: 27 meq/L (ref 19–32)
Calcium: 8.8 mg/dL (ref 8.4–10.5)
Chloride: 103 mEq/L (ref 96–112)
Creat: 1.04 mg/dL (ref 0.50–1.35)
GLUCOSE: 102 mg/dL — AB (ref 70–99)
POTASSIUM: 3.9 meq/L (ref 3.5–5.3)
SODIUM: 139 meq/L (ref 135–145)
TOTAL PROTEIN: 7.1 g/dL (ref 6.0–8.3)

## 2013-09-28 ENCOUNTER — Telehealth: Payer: Self-pay | Admitting: Cardiovascular Disease

## 2013-09-28 NOTE — Telephone Encounter (Signed)
Returning your call from yesterday. If you can reach him at [facility],please call on his cell [phone].

## 2013-10-05 NOTE — Telephone Encounter (Signed)
Pt still trying to get his lab results from about 2 or 3 weeks ago please.

## 2013-10-05 NOTE — Telephone Encounter (Signed)
Patient notified of lab results - informed that letter was mailed as well.

## 2013-11-08 ENCOUNTER — Other Ambulatory Visit: Payer: Self-pay | Admitting: *Deleted

## 2013-11-08 MED ORDER — OMEPRAZOLE 40 MG PO CPDR: 40.0000 mg | DELAYED_RELEASE_CAPSULE | Freq: Every day | ORAL | Status: DC

## 2013-11-08 NOTE — Telephone Encounter (Signed)
Received fax from [facility] requesting PA on lansoprazole. Per dr croitoru pt changed to omeprazole 40 mg daily Script sent into the pharm

## 2013-12-22 ENCOUNTER — Other Ambulatory Visit: Payer: Self-pay | Admitting: Cardiovascular Disease

## 2013-12-22 NOTE — Telephone Encounter (Signed)
Rx was sent to pharmacy electronically. 

## 2013-12-27 ENCOUNTER — Other Ambulatory Visit: Payer: Self-pay | Admitting: Cardiovascular Disease

## 2013-12-27 NOTE — Telephone Encounter (Signed)
Rx was sent to pharmacy electronically. 

## 2014-03-16 ENCOUNTER — Telehealth: Payer: Self-pay | Admitting: Cardiovascular Disease

## 2014-03-16 MED ORDER — METOPROLOL SUCCINATE ER 100 MG PO TB24
100.0000 mg | ORAL_TABLET | Freq: Every day | ORAL | Status: DC
Start: 2014-03-16 — End: 2014-10-12

## 2014-03-16 NOTE — Telephone Encounter (Signed)
Pt called in stating that for the past 2 wks his BP has been ranging from 150/100 to 160/110 . He would like to know if maybe his meds may need to be altered to lower his blood pressure. Please call  Thanks

## 2014-03-16 NOTE — Telephone Encounter (Signed)
Returned call to patient Dr.Croitoru advised if he has not been taking NSAIDS increase metoprolol to 100 mg daily.Stated he has not been taking NSAIDS.Advised to increase metoprolol to 100 mg daily.Prescription sent to pharmacy.

## 2014-03-16 NOTE — Telephone Encounter (Signed)
Received call from patient he stated his B/P has been elevated for the past 2 to 3 weeks.Ranging 150/100 to 160/110.Pulse ranging 60 to 90 bpm.Stated he is starting to get headaches.Also stated he is getting over planter fasciitis in rt heel and has been in a lot of pain.Stated pain better now.Appointment scheduled with Dr.Croitoru 04/25/14 at 3:00 pm.Stated he is taking medications as prescribed.Message sent to Dr.Croitoru for advice.

## 2014-03-16 NOTE — Telephone Encounter (Signed)
Is he taking NSAIDs for the heel pain? They can raise BP. If not, increase metoprolol to 100 mg daily please.

## 2014-04-25 ENCOUNTER — Encounter: Payer: Self-pay | Admitting: Cardiovascular Disease

## 2014-04-25 ENCOUNTER — Ambulatory Visit (INDEPENDENT_AMBULATORY_CARE_PROVIDER_SITE_OTHER): Payer: BLUE CROSS/BLUE SHIELD | Admitting: Cardiovascular Disease

## 2014-04-25 VITALS — BP 146/90 | HR 66 | Ht 72.0 in | Wt 276.7 lb

## 2014-04-25 DIAGNOSIS — I2510 Atherosclerotic heart disease of native coronary artery without angina pectoris: Secondary | ICD-10-CM

## 2014-04-25 DIAGNOSIS — E785 Hyperlipidemia, unspecified: Secondary | ICD-10-CM

## 2014-04-25 DIAGNOSIS — Z79899 Other long term (current) drug therapy: Secondary | ICD-10-CM

## 2014-04-25 MED ORDER — HYDROCHLOROTHIAZIDE 12.5 MG PO CAPS: 12.5000 mg | ORAL_CAPSULE | Freq: Every day | ORAL | Status: DC

## 2014-04-25 NOTE — Patient Instructions (Signed)
Your physician recommends that you return for lab work FASTING  START Hydrochlorothiazide 12.5 Daily  Your physician wants you to follow-up in: 1 year with Dr.Croitoru. You will receive a reminder letter in the mail two months in advance. If you don't receive a letter, please call our office to schedule the follow-up appointment.  Your physician encouraged you to lose weight for better health.

## 2014-04-25 NOTE — Progress Notes (Signed)
Patient ID: Adam Hunter, male   DOB: 08-Apr-1961, 54 y.o.   MRN: [id number]      Reason for office visit CAD, hypertension, hyperlipidemia  Adam Hunter had a small non-ST segment elevation myocardial infarction felt to be secondary to occlusion of the proximal septal branch of the LAD artery in 2011. He has not had any coronary event since. No other significant lesions were seen at the time of his coronary angiography although mild scattered plaque was present.  The problem over the last several years has been consistent and marked weight gain. He is approaching the morbidly obese range. He has gained about 12 pounds just since last year. This is reflected in a upper trend in his blood pressure. When he checks it at home he gets recording similar to what we saw in the office today just over 140/90. He does take a statin and his last lipid profile was favorable.  He has no cardiac vascular complaints. His blames his weight gain on inability to walk secondary to plantar fasciitis as well as a new job that makes him more sedentary. He confesses to very poor dietary discipline. He loves to snack on cookies and potatoes are one of his favorite foods.  No Known Allergies  Current Outpatient Prescriptions  Medication Sig Dispense Refill  . aspirin 325 MG EC tablet Take 325 mg by mouth daily.      [name] atorvastatin (LIPITOR) 10 MG tablet TAKE ONE (1) TABLET BY MOUTH AT BEDTIME 30 tablet 6  . clopidogrel (PLAVIX) 75 MG tablet TAKE 1 TABLET BY MOUTH DAILY 30 tablet 10  . losartan (COZAAR) 100 MG tablet TAKE 1 TABLET BY MOUTH ONCE DAILY 30 tablet 6  . metoprolol succinate (TOPROL XL) 100 MG 24 hr tablet Take 1 tablet (100 mg total) by mouth daily. Take with or immediately following a meal. 30 tablet 6  . niacin 500 MG tablet Take 500 mg by mouth daily with breakfast.      . omeprazole (PRILOSEC) 40 MG capsule Take 1 capsule (40 mg total) by mouth daily. 90 capsule 3   No current facility-administered medications  for this visit.    Past Medical History  Diagnosis Date  . Hypertension   . Heart attack 01/2010    non-STEMI  . Emphysema   . Hyperlipidemia   . CAD (coronary artery disease) 11/25/2012    STEMI October 2011 secondary to occlusion of a large septal branch of the LAD artery treated medically  . Hyperlipidemia 11/25/2012  . OSA on CPAP     Past Surgical History  Procedure Laterality Date  . Cardiac catheterization  01/20/2010    Diffuse coronary atherosclerosis w/coronary ectasia,totalled first septal perforator. non-obstructive LAD/diagonal,left CX and RCA stenosis  . US echocardiography  01/20/2010    mod. LVH,grade I diastolic dysfunction,mild AI,MR,MV annular ca+  . Nm myocar perf wall motion  05/22/2010    normal,EF 53%.  . Finger surgery  1974    Family History  Problem Relation Age of Onset  . Heart disease Father   . Cancer Maternal Grandfather   . Hypertension Mother   . Hypertension Father   . Heart attack Father     History   Social History  . Marital Status: Married    Spouse Name: N/A    Number of Children: N/A  . Years of Education: N/A   Occupational History  . Not on file.   Social History Main Topics  . Smoking status: Former Smoker -- 1.50 packs/day  for 30 years    Quit date: 01/22/2010  . Smokeless tobacco: Not on file  . Alcohol Use: Yes     Comment: social  . Drug Use: No  . Sexual Activity: Not on file   Other Topics Concern  . 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  The patient also denies abdominal pain, nausea, vomiting, dysphagia, diarrhea,  constipation, polyuria, polydipsia, dysuria, hematuria, frequency, urgency, abnormal bleeding or bruising, fever, chills, unexpected weight changes, mood swings, change in skin or hair texture, change in voice quality, auditory or visual problems, allergic reactions or rashes, new musculoskeletal complaints other than usual "aches and pains".   PHYSICAL EXAM BP 146/90 mmHg  Pulse 66  Ht 6' (1.829 m)  Wt 276 lb 11.2 oz (125.51 kg)  BMI 37.52 kg/m2 General: Alert, oriented x3, no distress  Head: no evidence of trauma, PERRL, EOMI, no exophtalmos or lid lag, no myxedema, no xanthelasma; normal ears, nose and oropharynx  Neck: normal jugular venous pulsations and no hepatojugular reflux; brisk carotid pulses without delay and no carotid bruits  Chest: clear to auscultation, no signs of consolidation by percussion or palpation, normal fremitus, symmetrical and full respiratory excursions  Cardiovascular: normal position and quality of the apical impulse, irregular rhythm, normal first and second heart sounds, no murmurs, rubs or gallops  Abdomen: no tenderness or distention, no masses by palpation, no abnormal pulsatility or arterial bruits, normal bowel sounds, no hepatosplenomegaly  Extremities: no clubbing, cyanosis; 2+ pitting edema of the left calf and pretibial area; 2+ radial, ulnar and brachial pulses bilaterally; 2+ right femoral, posterior tibial and dorsalis pedis pulses; 2+ left femoral, posterior tibial and dorsalis pedis pulses; no subclavian or femoral bruits  Neurological: grossly nonfocal  EKG: Atrial fibrillation, left axis deviation, no repolarization abnormalities   EKG: Sinus rhythm, minor nonspecific T-wave changes not much different from previous tracings  Lipid Panel     Component Value Date/Time   CHOL 116 09/24/2013 0930   TRIG 128 09/24/2013 0930   HDL 25* 09/24/2013 0930   CHOLHDL 4.6 09/24/2013 0930   VLDL 26 09/24/2013 0930   LDLCALC 65 09/24/2013 0930     BMET    Component Value Date/Time   NA 139 09/24/2013 0930   K 3.9 09/24/2013 0930   CL 103 09/24/2013 0930   CO2 27 09/24/2013 0930   GLUCOSE 102* 09/24/2013 0930   BUN 17 09/24/2013 0930   CREATININE 1.04 09/24/2013 0930   CREATININE 0.95 01/22/2010 0430   CALCIUM 8.8 09/24/2013 0930   GFRNONAA >60 01/22/2010 0430   GFRAA  01/22/2010 0430    >60        The eGFR has been calculated using the MDRD equation. This calculation has not been validated in all clinical situations. eGFR's persistently <60 mL/min signify possible Chronic Kidney Disease.     ASSESSMENT AND PLAN  No current symptoms of coronary insufficiency, but worsening risk factor profile. I'm very concerned that Adam Hunter simply does not acknowledge the seriousness of his cardiac problem in the impact of his on healthy diet and lifestyle. While he ashamedly acknowledges his dietary weaknesses, he makes excuses for his lack  of exercise. I had to tell him in no uncertain terms that unless he makes a radical change he is virtually guaranteed to have serious complications. We reviewed the importance of a calorie restricted low carbohydrate diet with low saturated fat and increased intake of unsaturated fatty acids and protein as well as regular physical exercise, preferably daily. I think his worsening blood pressure control is a direct consequence of his weight gain. Will add hydrochlorothiazide 12.5 mg once daily. Option to switch to combined Hyzaar tablet once we achieved good control. Time to recheck his lipid profile.  Orders Placed This Encounter  Procedures  . Lipid panel  . Comp Met (CMET)  . EKG 12-Lead   Meds ordered this encounter  Medications  . hydrochlorothiazide (MICROZIDE) 12.5 MG capsule    Sig: Take 1 capsule (12.5 mg total) by mouth daily.    Dispense:  90 capsule    Refill:  [place] Everlene Cunning, MD, [facility] [phone] office [phone] pager

## 2014-05-27 LAB — COMPREHENSIVE METABOLIC PANEL
ALK PHOS: 75 U/L (ref 39–117)
ALT: 31 U/L (ref 0–53)
AST: 24 U/L (ref 0–37)
Albumin: 4.2 g/dL (ref 3.5–5.2)
BILIRUBIN TOTAL: 0.7 mg/dL (ref 0.2–1.2)
BUN: 18 mg/dL (ref 6–23)
CO2: 30 mEq/L (ref 19–32)
Calcium: 9.4 mg/dL (ref 8.4–10.5)
Chloride: 99 mEq/L (ref 96–112)
Creat: 1.1 mg/dL (ref 0.50–1.35)
Glucose, Bld: 109 mg/dL — ABNORMAL HIGH (ref 70–99)
Potassium: 4 mEq/L (ref 3.5–5.3)
SODIUM: 138 meq/L (ref 135–145)
TOTAL PROTEIN: 7.5 g/dL (ref 6.0–8.3)

## 2014-05-27 LAB — LIPID PANEL
Cholesterol: 129 mg/dL (ref 0–200)
HDL: 25 mg/dL — AB (ref >39)
LDL Cholesterol: 71 mg/dL (ref 0–99)
TRIGLYCERIDES: 163 mg/dL — AB (ref <150)
Total CHOL/HDL Ratio: 5.2 Ratio
VLDL: 33 mg/dL (ref 0–40)

## 2014-07-20 ENCOUNTER — Other Ambulatory Visit: Payer: Self-pay | Admitting: Cardiovascular Disease

## 2014-07-29 ENCOUNTER — Other Ambulatory Visit: Payer: Self-pay | Admitting: Cardiovascular Disease

## 2014-10-12 ENCOUNTER — Other Ambulatory Visit: Payer: Self-pay | Admitting: Cardiovascular Disease

## 2014-10-12 NOTE — Telephone Encounter (Signed)
Rx(s) sent to pharmacy electronically.  

## 2014-12-01 ENCOUNTER — Other Ambulatory Visit: Payer: Self-pay | Admitting: Cardiovascular Disease

## 2014-12-02 NOTE — Telephone Encounter (Signed)
REFILL 

## 2015-04-12 ENCOUNTER — Other Ambulatory Visit: Payer: Self-pay | Admitting: Cardiovascular Disease

## 2015-04-12 NOTE — Telephone Encounter (Signed)
REFILL 

## 2015-04-29 ENCOUNTER — Other Ambulatory Visit: Payer: Self-pay | Admitting: Cardiovascular Disease

## 2015-05-01 NOTE — Telephone Encounter (Signed)
Rx request sent to pharmacy.  

## 2015-05-04 ENCOUNTER — Ambulatory Visit (INDEPENDENT_AMBULATORY_CARE_PROVIDER_SITE_OTHER): Payer: BLUE CROSS/BLUE SHIELD | Admitting: Cardiovascular Disease

## 2015-05-04 ENCOUNTER — Encounter: Payer: Self-pay | Admitting: Cardiovascular Disease

## 2015-05-04 VITALS — BP 128/84 | HR 70 | Ht 71.0 in | Wt 273.0 lb

## 2015-05-04 DIAGNOSIS — E785 Hyperlipidemia, unspecified: Secondary | ICD-10-CM | POA: Diagnosis not present

## 2015-05-04 DIAGNOSIS — I2510 Atherosclerotic heart disease of native coronary artery without angina pectoris: Secondary | ICD-10-CM

## 2015-05-04 DIAGNOSIS — Z79899 Other long term (current) drug therapy: Secondary | ICD-10-CM

## 2015-05-04 DIAGNOSIS — N529 Male erectile dysfunction, unspecified: Secondary | ICD-10-CM | POA: Insufficient documentation

## 2015-05-04 DIAGNOSIS — N522 Drug-induced erectile dysfunction: Secondary | ICD-10-CM

## 2015-05-04 MED ORDER — ASPIRIN EC 81 MG PO TBEC: 81.0000 mg | DELAYED_RELEASE_TABLET | Freq: Every day | ORAL | Status: AC

## 2015-05-04 MED ORDER — SILDENAFIL CITRATE 50 MG PO TABS: 50.0000 mg | ORAL_TABLET | Freq: Every day | ORAL | Status: AC | PRN

## 2015-05-04 NOTE — Patient Instructions (Addendum)
Dr Royann Shivers has recommended making the following medication changes: DECREASE Aspirin to 81 mg STOP Niacin TAKE Viagra 50 mg - NEVER TAKE NITRATES WITHIN 24 HOURS OF TAKING THIS MEDICATION  Your physician recommends that you return for lab work at your earliest convenience - FASTING.  Dr Royann Shivers recommends that you schedule a follow-up appointment in 1 year. You will receive a reminder letter in the mail two months in advance. If you don't receive a letter, please call our office to schedule the follow-up appointment.  If you need a refill on your cardiac medications before your next appointment, please call your pharmacy.

## 2015-05-04 NOTE — Progress Notes (Signed)
Patient ID: Adam Hunter, male   DOB: [birth year], 55 y.o.   MRN: [id number]    Cardiology Office Note    Date:  05/04/2015   ID:  Adam Hunter, DOB [birth year], MRN [id number]  PCP:  Kaleen Mask, MD  Cardiologist:   Thurmon Fair, MD   Chief Complaint  Patient presents with  . Annual Exam    no complaints.  f/u CAD, hypertension, hyperlipidemia  History of Present Illness:  Adam Hunter is a 55 y.o. male who had a small non-ST segment elevation myocardial infarction felt to be secondary to occlusion of the proximal septal branch of the LAD artery in 2011. He has not had any coronary event since. No other significant lesions were seen at the time of his coronary angiography although mild scattered plaque was present.  He has stopped gaining weight, but remains in the severely obese range. He now has good control of his blood pressure. Unfortunately, ever since we adjusted his BP meds (increased metoprolol and added HCTZ) he has developed erectile dysfunction. He does take a statin and his last lipid profile was favorable.  He has no cardiovascular complaints. He describes stinging in the toes of his left foot, sounds neuropathic. He blames his weight on his new job, that makes him more sedentary. He has a little improvement in otherwise poor dietary discipline. He loves to snack on cookies and potatoes are one of his favorite foods.   Past Medical History  Diagnosis Date  . Hypertension   . Heart attack (HCC) 01/2010    non-STEMI  . Emphysema   . Hyperlipidemia   . CAD (coronary artery disease) 11/25/2012    STEMI October 2011 secondary to occlusion of a large septal branch of the LAD artery treated medically  . Hyperlipidemia 11/25/2012  . OSA on CPAP     Past Surgical History  Procedure Laterality Date  . Cardiac catheterization  01/20/2010    Diffuse coronary atherosclerosis w/coronary ectasia,totalled first septal perforator. non-obstructive LAD/diagonal,left CX and RCA  stenosis  . US echocardiography  01/20/2010    mod. LVH,grade I diastolic dysfunction,mild AI,MR,MV annular ca+  . Nm myocar perf wall motion  05/22/2010    normal,EF 53%.  . Finger surgery  1974    Outpatient Prescriptions Prior to Visit  Medication Sig Dispense Refill  . atorvastatin (LIPITOR) 10 MG tablet TAKE 1 TABLET BY MOUTH EVERY NIGHT AT BEDTIME 30 tablet 0  . clopidogrel (PLAVIX) 75 MG tablet TAKE 1 TABLET BY MOUTH DAILY 30 tablet 10  . hydrochlorothiazide (MICROZIDE) 12.5 MG capsule TAKE 1 CAPSULE BY MOUTH DAILY 90 capsule 0  . losartan (COZAAR) 100 MG tablet TAKE 1 TABLET BY MOUTH DAILY 30 tablet 10  . metoprolol succinate (TOPROL-XL) 100 MG 24 hr tablet TAKE 1 TABLET BY MOUTH DAILY WITH OR IMMEDIATELY FOLLOWING A MEAL 30 tablet 6  . omeprazole (PRILOSEC) 40 MG capsule TAKE 1 CAPSULE BY MOUTH DAILY 90 capsule 2  . aspirin 325 MG EC tablet Take 325 mg by mouth daily.      . niacin 500 MG tablet Take 500 mg by mouth daily with breakfast.       No facility-administered medications prior to visit.     Allergies:   Review of patient's allergies indicates no known allergies.   Social History   Social History  . Marital Status: Married    Spouse Name: N/A  . Number of Children: N/A  . Years of Education: N/A   Social History Main  Topics  . Smoking status: Former Smoker -- 1.50 packs/day for 30 years    Quit date: 01/22/2010  . Smokeless tobacco: None  . Alcohol Use: Yes     Comment: social  . Drug Use: No  . Sexual Activity: Not Asked   Other Topics Concern  . 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   ROS:   Please see the history of present illness.    ROS All other systems reviewed and are negative.   PHYSICAL  EXAM:   VS:  BP 128/84 mmHg  Pulse 70  Ht  (1.803 m)  Wt 273 lb (123.832 kg)  BMI 38.09 kg/m2   GEN: Well nourished, well developed, in no acute distress HEENT: normal Neck: no JVD, carotid bruits, or masses Cardiac: RRR; no murmurs, rubs, or gallops,no edema  Respiratory:  clear to auscultation bilaterally, normal work of breathing GI: soft, nontender, nondistended, + BS MS: no deformity or atrophy Skin: warm and dry, no rash Neuro:  Alert and Oriented x 3, Strength and sensation are intact Psych: euthymic mood, full affect  Wt Readings from Last 3 Encounters:  05/04/15 273 lb (123.832 kg)  04/25/14 276 lb 11.2 oz (125.51 kg)  06/24/13 264 lb 12.8 oz (120.112 kg)      Studies/Labs Reviewed:   EKG:  EKG is ordered today.  The ekg ordered today demonstrates NSR, nonspecific ST changes (old)  Recent Labs: 05/27/2014: ALT 31; BUN 18; Creat 1.10; Potassium 4.0; Sodium 138   Lipid Panel    Component Value Date/Time   CHOL 129 05/27/2014 0911   TRIG 163* 05/27/2014 0911   HDL 25* 05/27/2014 0911   CHOLHDL 5.2 05/27/2014 0911   VLDL 33 05/27/2014 0911   LDLCALC 71 05/27/2014 0911     ASSESSMENT:    1. Coronary artery disease involving native coronary artery of native heart without angina pectoris   2. Hyperlipidemia   3. Medication management   4. Morbid obesity Hunter to excess calories (HCC)   5. Drug-induced erectile dysfunction      PLAN:  In order of problems listed above:  1. CAD: angina free, does not use nitrates 2. HLP: stop niacin, of doubtful benefit, recheck labs 3. As above, recheck CMET 4. Needs to drastically curtail carb/overall calorie intake and start exercising daily 5. Discussed use of PDE5 inhibitors and risk of nitrate interaction; also reviewed the fact that if he loses a lot of weigfht we can reduce his BP meds, with immediate positive impact. He did not want to change his BP meds.    Medication Adjustments/Labs and Tests  Ordered: Current medicines are reviewed at length with the patient today.  Concerns regarding medicines are outlined above.  Medication changes, Labs and Tests ordered today are listed in the Patient Instructions below. Patient Instructions  Dr Royann Shivers has recommended making the following medication changes: DECREASE Aspirin to 81 mg STOP Niacin TAKE Viagra 50 mg - NEVER TAKE NITRATES WITHIN 24 HOURS OF TAKING THIS MEDICATION  Your physician recommends that you return for lab work at your earliest convenience - FASTING.  Dr Royann Shivers recommends that you schedule a follow-up appointment in 1 year. You will receive a reminder letter in the mail two months in  advance. If you don't receive a letter, please call our office to schedule the follow-up appointment.  If you need a refill on your cardiac medications before your next appointment, please call your pharmacy.      Adam Bimler, MD  05/04/2015 5:42 PM    [facility] [address] [place], [place], [place]  [address] Phone: [phone]; Fax: [phone]

## 2015-05-08 ENCOUNTER — Other Ambulatory Visit: Payer: Self-pay | Admitting: Cardiovascular Disease

## 2015-05-08 NOTE — Telephone Encounter (Signed)
Rx request sent to pharmacy.  

## 2015-05-23 ENCOUNTER — Other Ambulatory Visit: Payer: Self-pay | Admitting: Cardiovascular Disease

## 2015-05-24 NOTE — Telephone Encounter (Signed)
Rx request sent to pharmacy.  

## 2015-06-13 ENCOUNTER — Other Ambulatory Visit: Payer: Self-pay | Admitting: Cardiovascular Disease

## 2015-06-14 NOTE — Telephone Encounter (Signed)
Rx(s) sent to pharmacy electronically.  

## 2015-07-05 LAB — COMPREHENSIVE METABOLIC PANEL
ALBUMIN: 4.2 g/dL (ref 3.6–5.1)
ALT: 28 U/L (ref 9–46)
AST: 21 U/L (ref 10–35)
Alkaline Phosphatase: 65 U/L (ref 40–115)
BUN: 14 mg/dL (ref 7–25)
CHLORIDE: 100 mmol/L (ref 98–110)
CO2: 30 mmol/L (ref 20–31)
CREATININE: 1.05 mg/dL (ref 0.70–1.33)
Calcium: 9.3 mg/dL (ref 8.6–10.3)
Glucose, Bld: 115 mg/dL — ABNORMAL HIGH (ref 65–99)
POTASSIUM: 3.9 mmol/L (ref 3.5–5.3)
SODIUM: 140 mmol/L (ref 135–146)
Total Bilirubin: 0.6 mg/dL (ref 0.2–1.2)
Total Protein: 7.2 g/dL (ref 6.1–8.1)

## 2015-07-05 LAB — LIPID PANEL
CHOL/HDL RATIO: 5.5 ratio — AB (ref <=5.0)
Cholesterol: 126 mg/dL (ref 125–200)
HDL: 23 mg/dL — AB (ref >=40)
LDL CALC: 69 mg/dL (ref <130)
Triglycerides: 169 mg/dL — ABNORMAL HIGH (ref <150)
VLDL: 34 mg/dL — ABNORMAL HIGH (ref <30)

## 2015-07-10 ENCOUNTER — Telehealth: Payer: Self-pay | Admitting: Cardiovascular Disease

## 2015-07-10 NOTE — Telephone Encounter (Signed)
New message ° ° ° ° °Returning a call to the nurse to get lab results °

## 2015-07-10 NOTE — Telephone Encounter (Signed)
Results given, notes updated. 

## 2015-07-12 ENCOUNTER — Other Ambulatory Visit: Payer: Self-pay | Admitting: Cardiovascular Disease

## 2015-07-13 NOTE — Telephone Encounter (Signed)
Rx(s) sent to pharmacy electronically.  

## 2015-08-31 ENCOUNTER — Other Ambulatory Visit: Payer: Self-pay | Admitting: Cardiovascular Disease

## 2015-09-01 NOTE — Telephone Encounter (Signed)
Rx(s) sent to pharmacy electronically.  

## 2016-04-19 ENCOUNTER — Encounter: Payer: Self-pay | Admitting: Cardiovascular Disease

## 2016-04-19 ENCOUNTER — Ambulatory Visit (INDEPENDENT_AMBULATORY_CARE_PROVIDER_SITE_OTHER): Payer: BLUE CROSS/BLUE SHIELD | Admitting: Cardiovascular Disease

## 2016-04-19 VITALS — BP 122/84 | HR 60 | Ht 71.0 in | Wt 279.0 lb

## 2016-04-19 DIAGNOSIS — Z79899 Other long term (current) drug therapy: Secondary | ICD-10-CM | POA: Diagnosis not present

## 2016-04-19 DIAGNOSIS — I2510 Atherosclerotic heart disease of native coronary artery without angina pectoris: Secondary | ICD-10-CM

## 2016-04-19 DIAGNOSIS — N522 Drug-induced erectile dysfunction: Secondary | ICD-10-CM

## 2016-04-19 DIAGNOSIS — E785 Hyperlipidemia, unspecified: Secondary | ICD-10-CM | POA: Diagnosis not present

## 2016-04-19 NOTE — Patient Instructions (Signed)
Dr Croitoru recommends that you continue on your current medications as directed. Please refer to the Current Medication list given to you today.  Your physician recommends that you return for lab work at your convenience - FASTING.  Dr Croitoru recommends that you schedule a follow-up appointment in 1 year. You will receive a reminder letter in the mail two months in advance. If you don't receive a letter, please call our office to schedule the follow-up appointment.  If you need a refill on your cardiac medications before your next appointment, please call your pharmacy. 

## 2016-04-19 NOTE — Progress Notes (Signed)
Cardiology Office Note    Date:  04/19/2016   ID:  Adam SacramentoDale L Nault, DOB 03/10/1961, MRN [id number]  PCP:  Kaleen MaskELKINS,WILSON OLIVER, MD  Cardiologist:   Thurmon FairMihai Kaoru Rezendes, MD   Chief Complaint  Patient presents with  . Follow-up    History of Present Illness:  Adam Hunter is a 56 y.o. male who presented with an acute ST segment elevation myocardial infarction October 2011 that only required medical treatment, since the MI was secondary to occlusion of a large septal branch of the LAD artery. He has hypertension, hyperlipidemia, obstructive sleep apnea on CPAP and a history of smoking. After quitting smoking he has steadily gained weight and is now severely obese. Thankfully, his rate a weight gain has slowed. He continues to work full time and is able to perform intense physical activity without angina or shortness of breath. He has mild erectile dysfunction that responds well to treatment with sildenafil. His blood pressure is well controlled. He denies edema, palpitations, syncope, claudication or other cardiovascular complaints.    Past Medical History:  Diagnosis Date  . CAD (coronary artery disease) 11/25/2012   STEMI October 2011 secondary to occlusion of a large septal branch of the LAD artery treated medically  . Emphysema   . Heart attack 01/2010   non-STEMI  . Hyperlipidemia   . Hyperlipidemia 11/25/2012  . Hypertension   . OSA on CPAP     Past Surgical History:  Procedure Laterality Date  . CARDIAC CATHETERIZATION  01/20/2010   Diffuse coronary atherosclerosis w/coronary ectasia,totalled first septal perforator. non-obstructive LAD/diagonal,left CX and RCA stenosis  . FINGER SURGERY  1974  . NM MYOCAR PERF WALL MOTION  05/22/2010   normal,EF 53%.  . US ECHOCARDIOGRAPHY  01/20/2010   mod. LVH,grade I diastolic dysfunction,mild AI,MR,MV annular ca+    Current Medications: Outpatient Medications Prior to Visit  Medication Sig Dispense Refill  . aspirin EC 81 MG tablet Take 1  tablet (81 mg total) by mouth daily. 90 tablet 3  . atorvastatin (LIPITOR) 10 MG tablet TAKE 1 TABLET BY MOUTH DAILY AT BEDTIME 30 tablet 11  . clopidogrel (PLAVIX) 75 MG tablet TAKE 1 TABLET BY MOUTH DAILY 30 tablet 10  . hydrochlorothiazide (MICROZIDE) 12.5 MG capsule TAKE 1 CAPSULE BY MOUTH ONCE DAILY 90 capsule 3  . losartan (COZAAR) 100 MG tablet TAKE 1 TABLET BY MOUTH DAILY 30 tablet 10  . metoprolol succinate (TOPROL-XL) 100 MG 24 hr tablet TAKE 1 TABLET BY MOUTH DAILY WITH OR IMMEDIATELY FOLLOWING A MEAL 30 tablet 11  . omeprazole (PRILOSEC) 40 MG capsule TAKE 1 CAPSULE BY MOUTH DAILY 90 capsule 2  . sildenafil (VIAGRA) 50 MG tablet Take 1 tablet (50 mg total) by mouth daily as needed for erectile dysfunction. 10 tablet 3   No facility-administered medications prior to visit.      Allergies:   Patient has no known allergies.   Social History   Social History  . Marital status: Married    Spouse name: N/A  . Number of children: N/A  . Years of education: N/A   Social History Main Topics  . Smoking status: Former Smoker    Packs/day: 1.50    Years: 30.00    Quit date: 01/22/2010  . Smokeless tobacco: None  . Alcohol use Yes     Comment: social  . Drug use: No  . Sexual activity: Not Asked   Other Topics Concern  . 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   ROS:   Please see the history of present illness.    ROS All other systems reviewed and are negative.   PHYSICAL EXAM:   VS:  BP 122/84 (BP Location: Right Arm, Patient Position: Sitting, Cuff Size: Large)   Pulse 60   Ht 5\' 11"  (1.803 m)   Wt 279 lb (126.6 kg)   BMI 38.91 kg/m    GEN: Well nourished, well developed, in no acute distress  HEENT: normal    Neck: no JVD, carotid bruits, or masses Cardiac: RRR; no murmurs, rubs, or gallops,no edema  Respiratory:  clear to auscultation bilaterally, normal work of breathing GI: soft, nontender, nondistended, + BS MS: no deformity or atrophy  Skin: warm and dry, no rash Neuro:  Alert and Oriented x 3, Strength and sensation are intact Psych: euthymic mood, full affect  Wt Readings from Last 3 Encounters:  04/19/16 279 lb (126.6 kg)  05/04/15 273 lb (123.8 kg)  04/25/14 276 lb 11.2 oz (125.5 kg)      Studies/Labs Reviewed:   EKG:  EKG is ordered today.  The ekg ordered today demonstrates Normal sinus rhythm, nonspecific T-wave changes  Recent Labs: 07/04/2015: ALT 28; BUN 14; Creat 1.05; Potassium 3.9; Sodium 140   Lipid Panel    Component Value Date/Time   CHOL 126 07/04/2015 0917   TRIG 169 (H) 07/04/2015 0917   HDL 23 (L) 07/04/2015 0917   CHOLHDL 5.5 (H) 07/04/2015 0917   VLDL 34 (H) 07/04/2015 0917   LDLCALC 69 07/04/2015 0917     ASSESSMENT:    1. Coronary artery disease involving native coronary artery of native heart without angina pectoris   2. Dyslipidemia   3. Morbid obesity (HCC)   4. Drug-induced erectile dysfunction   5. Medication management      PLAN:  In order of problems listed above:  1. CAD: Asymptomatic, no angina. He does not use nitrates. The focus is on risk factor management 2. HLP: Excellent LDL under 71 last tested. He also has a very low HDL but will not improve without substantial weight loss and increasing physical activity 3. Obesity: Again went over ways to lose weight, stressing the importance of a low carbohydrate low saturated fat high protein diet with intake of starches with low glycemic index (Mediterranean or [employer]) as well as daily physical activity. He does walk every day. 4. ED: He uses phosphodiesterase inhibitors infrequently. He knows that he should never be combined with nitrates.    Medication Adjustments/Labs  and Tests Ordered: Current medicines are reviewed at length with the patient today.  Concerns regarding medicines are outlined above.  Medication changes, Labs and Tests ordered today are listed in the Patient Instructions below. Patient Instructions  Dr Royann Shivers recommends that you continue on your current medications as directed. Please refer to the Current Medication list given to you today.  Your physician recommends that you return for lab work at your convenience - FASTING.  Dr Royann Shivers recommends that you schedule a follow-up appointment in 1 year. You will receive a reminder letter in the mail two months in advance. If you don't receive a letter, please call our office to schedule the follow-up appointment.  If you need a refill on your  cardiac medications before your next appointment, please call your pharmacy.    Signed, Thurmon Fair, MD  04/19/2016 6:33 PM    [facility] [address] [place], [place], [place]  [address] Phone: [phone]; Fax: [phone]

## 2016-05-08 ENCOUNTER — Other Ambulatory Visit: Payer: Self-pay | Admitting: Cardiovascular Disease

## 2016-05-16 ENCOUNTER — Other Ambulatory Visit: Payer: Self-pay | Admitting: Cardiovascular Disease

## 2016-05-17 LAB — COMPREHENSIVE METABOLIC PANEL
ALBUMIN: 4.3 g/dL (ref 3.6–5.1)
ALK PHOS: 71 U/L (ref 40–115)
ALT: 40 U/L (ref 9–46)
AST: 32 U/L (ref 10–35)
BILIRUBIN TOTAL: 0.8 mg/dL (ref 0.2–1.2)
BUN: 11 mg/dL (ref 7–25)
CALCIUM: 9.4 mg/dL (ref 8.6–10.3)
CO2: 31 mmol/L (ref 20–31)
Chloride: 100 mmol/L (ref 98–110)
Creat: 1.24 mg/dL (ref 0.70–1.33)
GLUCOSE: 109 mg/dL — AB (ref 65–99)
Potassium: 3.8 mmol/L (ref 3.5–5.3)
Sodium: 140 mmol/L (ref 135–146)
Total Protein: 7.7 g/dL (ref 6.1–8.1)

## 2016-05-17 LAB — LIPID PANEL
Cholesterol: 134 mg/dL (ref <200)
HDL: 25 mg/dL — AB (ref >40)
LDL Cholesterol: 71 mg/dL (ref <100)
Total CHOL/HDL Ratio: 5.4 Ratio — ABNORMAL HIGH (ref <5.0)
Triglycerides: 192 mg/dL — ABNORMAL HIGH (ref <150)
VLDL: 38 mg/dL — ABNORMAL HIGH (ref <30)

## 2016-05-20 ENCOUNTER — Other Ambulatory Visit: Payer: Self-pay | Admitting: Cardiovascular Disease

## 2016-05-27 ENCOUNTER — Telehealth: Payer: Self-pay | Admitting: Cardiovascular Disease

## 2016-05-27 NOTE — Telephone Encounter (Signed)
New message   Pt returning call about lab work. Please call pt cell phone number.

## 2016-05-27 NOTE — Telephone Encounter (Signed)
Patient notified of lab results

## 2016-07-09 ENCOUNTER — Other Ambulatory Visit: Payer: Self-pay | Admitting: Cardiovascular Disease

## 2016-07-09 NOTE — Telephone Encounter (Signed)
Rx(s) sent to pharmacy electronically.  

## 2016-07-17 ENCOUNTER — Other Ambulatory Visit: Payer: Self-pay | Admitting: Cardiovascular Disease

## 2016-07-18 NOTE — Telephone Encounter (Signed)
REFILL 

## 2016-08-07 ENCOUNTER — Other Ambulatory Visit: Payer: Self-pay | Admitting: Cardiovascular Disease

## 2016-11-19 ENCOUNTER — Other Ambulatory Visit: Payer: Self-pay | Admitting: Cardiovascular Disease

## 2017-03-04 ENCOUNTER — Other Ambulatory Visit: Payer: Self-pay | Admitting: Cardiovascular Disease

## 2017-04-10 ENCOUNTER — Other Ambulatory Visit: Payer: Self-pay | Admitting: Cardiovascular Disease

## 2017-04-22 ENCOUNTER — Encounter: Payer: Self-pay | Admitting: Cardiovascular Disease

## 2017-04-22 ENCOUNTER — Ambulatory Visit (INDEPENDENT_AMBULATORY_CARE_PROVIDER_SITE_OTHER): Payer: BLUE CROSS/BLUE SHIELD | Admitting: Cardiovascular Disease

## 2017-04-22 VITALS — BP 138/90 | HR 79 | Ht 72.0 in | Wt 277.0 lb

## 2017-04-22 DIAGNOSIS — Z79899 Other long term (current) drug therapy: Secondary | ICD-10-CM

## 2017-04-22 DIAGNOSIS — E668 Other obesity: Secondary | ICD-10-CM | POA: Diagnosis not present

## 2017-04-22 DIAGNOSIS — E785 Hyperlipidemia, unspecified: Secondary | ICD-10-CM

## 2017-04-22 DIAGNOSIS — N522 Drug-induced erectile dysfunction: Secondary | ICD-10-CM | POA: Diagnosis not present

## 2017-04-22 DIAGNOSIS — I2510 Atherosclerotic heart disease of native coronary artery without angina pectoris: Secondary | ICD-10-CM

## 2017-04-22 LAB — COMPREHENSIVE METABOLIC PANEL
A/G RATIO: 1.5 (ref 1.2–2.2)
ALBUMIN: 4.5 g/dL (ref 3.5–5.5)
ALT: 40 IU/L (ref 0–44)
AST: 30 IU/L (ref 0–40)
Alkaline Phosphatase: 79 IU/L (ref 39–117)
BILIRUBIN TOTAL: 0.6 mg/dL (ref 0.0–1.2)
BUN / CREAT RATIO: 14 (ref 9–20)
BUN: 15 mg/dL (ref 6–24)
CALCIUM: 9.3 mg/dL (ref 8.7–10.2)
CHLORIDE: 99 mmol/L (ref 96–106)
CO2: 24 mmol/L (ref 20–29)
Creatinine, Ser: 1.06 mg/dL (ref 0.76–1.27)
GFR, EST AFRICAN AMERICAN: 90 mL/min/{1.73_m2} (ref >59)
GFR, EST NON AFRICAN AMERICAN: 78 mL/min/{1.73_m2} (ref >59)
Globulin, Total: 3 g/dL (ref 1.5–4.5)
Glucose: 130 mg/dL — ABNORMAL HIGH (ref 65–99)
Potassium: 3.8 mmol/L (ref 3.5–5.2)
Sodium: 141 mmol/L (ref 134–144)
TOTAL PROTEIN: 7.5 g/dL (ref 6.0–8.5)

## 2017-04-22 LAB — LIPID PANEL
Chol/HDL Ratio: 4.8 ratio (ref 0.0–5.0)
Cholesterol, Total: 129 mg/dL (ref 100–199)
HDL: 27 mg/dL — AB (ref >39)
LDL Calculated: 75 mg/dL (ref 0–99)
TRIGLYCERIDES: 134 mg/dL (ref 0–149)
VLDL Cholesterol Cal: 27 mg/dL (ref 5–40)

## 2017-04-22 NOTE — Patient Instructions (Signed)
Dr Croitoru recommends that you continue on your current medications as directed. Please refer to the Current Medication list given to you today.  Your physician recommends that you return for lab work at your earliest convenience - FASTING.  Dr Croitoru recommends that you schedule a follow-up appointment in 12 months. You will receive a reminder letter in the mail two months in advance. If you don't receive a letter, please call our office to schedule the follow-up appointment.  If you need a refill on your cardiac medications before your next appointment, please call your pharmacy. 

## 2017-04-22 NOTE — Progress Notes (Signed)
Cardiology Office Note    Date:  04/22/2017   ID:  Adam Hunter, DOB 1960/11/23, MRN [id number]  PCP:  Kaleen Mask, MD  Cardiologist:   Thurmon Fair, MD   Chief Complaint  Patient presents with  . Follow-up    12 months    History of Present Illness:  Adam Hunter is a 57 y.o. male who presented with an acute ST segment elevation myocardial infarction October 2011 that only required medical treatment, since the MI was secondary to occlusion of a large septal branch of the LAD artery. He has hypertension, hyperlipidemia, obstructive sleep apnea on CPAP and a history of smoking. After quitting smoking he has steadily gained weight and is now severely obese.   For the first time in many years he has not gained any additional weight between yearly visits, although he remains severely obese with a BMI of 37. He reports that he tries to limit his intake of starches. He does not really exercise regularly.  The patient specifically denies any chest pain at rest exertion, dyspnea at rest or with exertion, orthopnea, paroxysmal nocturnal dyspnea, syncope, palpitations, focal neurological deficits, intermittent claudication, lower extremity edema, unexplained weight gain, cough, hemoptysis or wheezing. He has mild erectile dysfunction that responds well to treatment with sildenafil. He does not always need to use that medication.  Past Medical History:  Diagnosis Date  . CAD (coronary artery disease) 11/25/2012   STEMI October 2011 secondary to occlusion of a large septal branch of the LAD artery treated medically  . Emphysema   . Heart attack (HCC) 01/2010   non-STEMI  . Hyperlipidemia   . Hyperlipidemia 11/25/2012  . Hypertension   . OSA on CPAP     Past Surgical History:  Procedure Laterality Date  . CARDIAC CATHETERIZATION  01/20/2010   Diffuse coronary atherosclerosis w/coronary ectasia,totalled first septal perforator. non-obstructive LAD/diagonal,left CX and RCA stenosis    . FINGER SURGERY  1974  . NM MYOCAR PERF WALL MOTION  05/22/2010   normal,EF 53%.  . US ECHOCARDIOGRAPHY  01/20/2010   mod. LVH,grade I diastolic dysfunction,mild AI,MR,MV annular ca+    Current Medications: Outpatient Medications Prior to Visit  Medication Sig Dispense Refill  . aspirin EC 81 MG tablet Take 1 tablet (81 mg total) by mouth daily. 90 tablet 3  . atorvastatin (LIPITOR) 10 MG tablet TAKE 1 TABLET BY MOUTH EACH NIGHT AT BEDTIME 30 tablet 11  . clopidogrel (PLAVIX) 75 MG tablet TAKE 1 TABLET BY MOUTH DAILY 30 tablet 2  . hydrochlorothiazide (MICROZIDE) 12.5 MG capsule TAKE 1 CAPSULE BY MOUTH DAILY 90 capsule 2  . losartan (COZAAR) 100 MG tablet TAKE 1 TABLET BY MOUTH ONCE DAILY 30 tablet 11  . metoprolol succinate (TOPROL-XL) 100 MG 24 hr tablet TAKE 1 TABLET BY MOUTH DAILY WITH OR IMMEDIATELY FOLLOWING A MEAL 30 tablet 2  . omeprazole (PRILOSEC) 40 MG capsule TAKE 1 CAPSULE BY MOUTH DAILY 90 capsule 1  . sildenafil (VIAGRA) 50 MG tablet Take 1 tablet (50 mg total) by mouth daily as needed for erectile dysfunction. 10 tablet 3   No facility-administered medications prior to visit.      Allergies:   Patient has no known allergies.   Social History   Socioeconomic History  . Marital status: Married    Spouse name: None  . Number of children: None  . Years of education: None  . Highest education level: None  Social Needs  . Financial resource strain: None  .  Food insecurity - worry: None  . Food insecurity - inability: None  . Transportation needs - medical: None  . Transportation needs - non-medical: None  Occupational History  . None  Tobacco Use  . Smoking status: Former Smoker    Packs/day: 1.50    Years: 30.00    Pack years: 45.00    Last attempt to quit: 01/22/2010    Years since quitting: 7.2  . Smokeless tobacco: Former Engineer, water and Sexual Activity  . Alcohol use: Yes    Comment: social  . Drug use: No  . Sexual activity: None  Other Topics  Concern  . 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   ROS:   Please see the history of present illness.    ROS All other systems reviewed and are negative.   PHYSICAL EXAM:   VS:  BP 138/90   Pulse 79   Ht 6' (1.829 m)   Wt 277 lb (125.6 kg)   BMI 37.57 kg/m     General: Alert, oriented x3, no distress, obese Head: no evidence of trauma, PERRL, EOMI, no exophtalmos or lid lag, no myxedema, no xanthelasma; normal ears, nose and oropharynx Neck: normal jugular venous pulsations and no hepatojugular reflux; brisk carotid pulses without delay and no carotid bruits Chest: clear to auscultation, no signs of consolidation by percussion or palpation, normal fremitus, symmetrical and full respiratory excursions Cardiovascular: normal position and quality of the apical impulse, regular rhythm, normal first and second heart sounds, no murmurs, rubs or gallops Abdomen: no tenderness or distention, no masses by palpation, no abnormal pulsatility or arterial bruits, normal bowel sounds, no hepatosplenomegaly Extremities: no clubbing, cyanosis or edema; 2+ radial, ulnar and brachial pulses bilaterally; 2+ right femoral, posterior tibial and dorsalis pedis pulses; 2+ left femoral, posterior tibial and dorsalis pedis pulses; no subclavian or femoral bruits Neurological: grossly nonfocal Psych: Normal mood and affect   Wt Readings from Last 3 Encounters:  04/22/17 277 lb (125.6 kg)  04/19/16 279 lb (126.6 kg)  05/04/15 273 lb (123.8 kg)      Studies/Labs Reviewed:   EKG:  EKG is ordered today.  The ekg ordered today demonstrates Normal sinus rhythm, normal tracing. QTc 441 ms.  Recent Labs: 05/16/2016: ALT 40; BUN 11; Creat 1.24; Potassium 3.8;  Sodium 140   Lipid Panel    Component Value Date/Time   CHOL 134 05/16/2016 1022   TRIG 192 (H) 05/16/2016 1022   HDL 25 (L) 05/16/2016 1022   CHOLHDL 5.4 (H) 05/16/2016 1022   VLDL 38 (H) 05/16/2016 1022   LDLCALC 71 05/16/2016 1022     ASSESSMENT:    1. Coronary artery disease involving native coronary artery of native heart without angina pectoris   2. Dyslipidemia   3. Moderate obesity   4. Drug-induced erectile dysfunction   5. Medication management      PLAN:  In order of problems listed above:  1. CAD: Asymptomatic, no angina. He does not take nitrates. 2. HLP: LDL has been well controlled, but he has a chronically low HDL, which is unlikely to improve without significant weight loss. Compliant with and tolerant of statin 3. Obesity: Reemphasized that he needs to both limit calories and exercise regularly to  achieve weight loss. 4. ED: Occasionally uses sildenafil. Reminded him that this should never be combined with nitrates.    Medication Adjustments/Labs and Tests Ordered: Current medicines are reviewed at length with the patient today.  Concerns regarding medicines are outlined above.  Medication changes, Labs and Tests ordered today are listed in the Patient Instructions below. Patient Instructions  Dr Royann Shiversroitoru recommends that you continue on your current medications as directed. Please refer to the Current Medication list given to you today.  Your physician recommends that you return for lab work at your earliest convenience - FASTING.  Dr Royann Shiversroitoru recommends that you schedule a follow-up appointment in 12 months. You will receive a reminder letter in the mail two months in advance. If you don't receive a letter, please call our office to schedule the follow-up appointment.  If you need a refill on your cardiac medications before your next appointment, please call your pharmacy.    Signed, Thurmon FairMihai Mailani Degroote, MD  04/22/2017 1:33 PM    [facility]  [facility] [address], [place], [place]  [address] Phone: [phone]; Fax: [phone]     Cardiology Office Note    Date:  04/22/2017   ID:  Adam Hunter, DOB 08/24/1960, MRN [id number]  PCP:  Kaleen MaskElkins, Wilson Oliver, MD  Cardiologist:   Thurmon FairMihai Rasmus Preusser, MD   Chief Complaint  Patient presents with  . Follow-up    12 months    History of Present Illness:  Adam Hunter is a 57 y.o. male who presented with an acute ST segment elevation myocardial infarction October 2011 that only required medical treatment, since the MI was secondary to occlusion of a large septal branch of the LAD artery. He has hypertension, hyperlipidemia, obstructive sleep apnea on CPAP and a history of smoking. After quitting smoking he has steadily gained weight and is now severely obese. Thankfully, his rate a weight gain has slowed. He continues to work full time and is able to perform intense physical activity without angina or shortness of breath. He has mild erectile dysfunction that responds well to treatment with sildenafil. His blood pressure is well controlled. He denies edema, palpitations, syncope, claudication or other cardiovascular complaints.    Past Medical History:  Diagnosis Date  . CAD (coronary artery disease) 11/25/2012   STEMI October 2011 secondary to occlusion of a large septal branch of the LAD artery treated medically  . Emphysema   . Heart attack (HCC) 01/2010   non-STEMI  . Hyperlipidemia   . Hyperlipidemia 11/25/2012  . Hypertension   . OSA on CPAP     Past Surgical History:  Procedure Laterality Date  . CARDIAC CATHETERIZATION  01/20/2010   Diffuse coronary atherosclerosis w/coronary ectasia,totalled first septal perforator. non-obstructive LAD/diagonal,left CX and RCA stenosis  . FINGER SURGERY  1974  . NM MYOCAR PERF WALL MOTION  05/22/2010   normal,EF 53%.  . US ECHOCARDIOGRAPHY  01/20/2010   mod. LVH,grade I diastolic dysfunction,mild AI,MR,MV annular ca+    Current  Medications: Outpatient Medications Prior to Visit  Medication Sig Dispense Refill  . aspirin EC 81 MG tablet Take 1 tablet (81 mg total) by mouth daily. 90 tablet 3  . atorvastatin (LIPITOR) 10 MG tablet TAKE 1 TABLET BY MOUTH EACH NIGHT AT BEDTIME 30 tablet 11  . clopidogrel (PLAVIX) 75 MG tablet TAKE 1 TABLET BY MOUTH DAILY 30 tablet 2  . hydrochlorothiazide (MICROZIDE) 12.5 MG capsule TAKE 1 CAPSULE BY MOUTH DAILY 90 capsule 2  .  losartan (COZAAR) 100 MG tablet TAKE 1 TABLET BY MOUTH ONCE DAILY 30 tablet 11  . metoprolol succinate (TOPROL-XL) 100 MG 24 hr tablet TAKE 1 TABLET BY MOUTH DAILY WITH OR IMMEDIATELY FOLLOWING A MEAL 30 tablet 2  . omeprazole (PRILOSEC) 40 MG capsule TAKE 1 CAPSULE BY MOUTH DAILY 90 capsule 1  . sildenafil (VIAGRA) 50 MG tablet Take 1 tablet (50 mg total) by mouth daily as needed for erectile dysfunction. 10 tablet 3   No facility-administered medications prior to visit.      Allergies:   Patient has no known allergies.   Social History   Socioeconomic History  . Marital status: Married    Spouse name: None  . Number of children: None  . Years of education: None  . Highest education level: None  Social Needs  . Financial resource strain: None  . Food insecurity - worry: None  . Food insecurity - inability: None  . Transportation needs - medical: None  . Transportation needs - non-medical: None  Occupational History  . None  Tobacco Use  . Smoking status: Former Smoker    Packs/day: 1.50    Years: 30.00    Pack years: 45.00    Last attempt to quit: 01/22/2010    Years since quitting: 7.2  . Smokeless tobacco: Former Engineer, water and Sexual Activity  . Alcohol use: Yes    Comment: social  . Drug use: No  . Sexual activity: None  Other Topics Concern  . 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   ROS:   Please see the history of present illness.    ROS All other systems reviewed and are negative.   PHYSICAL EXAM:   VS:  BP 138/90   Pulse 79   Ht 6' (1.829 m)   Wt 277 lb (125.6 kg)   BMI 37.57 kg/m    GEN: Well nourished, well developed, in no acute distress  HEENT: normal  Neck: no JVD, carotid bruits, or masses Cardiac: RRR; no murmurs, rubs, or gallops,no edema  Respiratory:  clear to auscultation bilaterally, normal work of breathing GI: soft, nontender, nondistended, + BS MS: no deformity or atrophy  Skin: warm and dry, no rash Neuro:  Alert and Oriented x 3, Strength and sensation are intact Psych: euthymic mood, full affect  Wt Readings from Last 3 Encounters:  04/22/17 277 lb (125.6 kg)  04/19/16 279 lb (126.6 kg)  05/04/15 273 lb (123.8 kg)      Studies/Labs Reviewed:   EKG:  EKG is ordered today.  The ekg ordered today demonstrates Normal sinus rhythm, nonspecific T-wave changes  Recent Labs: 05/16/2016: ALT 40; BUN 11; Creat 1.24; Potassium 3.8; Sodium 140   Lipid Panel    Component Value Date/Time   CHOL 134 05/16/2016 1022   TRIG 192 (H) 05/16/2016 1022   HDL 25 (L) 05/16/2016 1022   CHOLHDL 5.4 (H) 05/16/2016 1022   VLDL 38 (H) 05/16/2016 1022   LDLCALC 71 05/16/2016 1022     ASSESSMENT:    1. Coronary artery disease involving native coronary artery of native heart without angina pectoris   2. Dyslipidemia   3. Moderate obesity   4. Drug-induced erectile dysfunction   5. Medication management  PLAN:  In order of problems listed above:  5. CAD: Asymptomatic, no angina. He does not use nitrates. The focus is on risk factor management 6. HLP: Excellent LDL under 71 last tested. He also has a very low HDL but will not improve without substantial weight loss and increasing physical activity 7. Obesity: Again went over ways  to lose weight, stressing the importance of a low carbohydrate low saturated fat high protein diet with intake of starches with low glycemic index (Mediterranean or [employer]) as well as daily physical activity. He does walk every day. 8. ED: He uses phosphodiesterase inhibitors infrequently. He knows that he should never be combined with nitrates.    Medication Adjustments/Labs and Tests Ordered: Current medicines are reviewed at length with the patient today.  Concerns regarding medicines are outlined above.  Medication changes, Labs and Tests ordered today are listed in the Patient Instructions below. Patient Instructions  Dr Royann Shivers recommends that you continue on your current medications as directed. Please refer to the Current Medication list given to you today.  Your physician recommends that you return for lab work at your earliest convenience - FASTING.  Dr Royann Shivers recommends that you schedule a follow-up appointment in 12 months. You will receive a reminder letter in the mail two months in advance. If you don't receive a letter, please call our office to schedule the follow-up appointment.  If you need a refill on your cardiac medications before your next appointment, please call your pharmacy.    Signed, Thurmon Fair, MD  04/22/2017 1:33 PM    [facility] [address] [place], [place], [place]  [address] Phone: [phone]; Fax: [phone]

## 2017-04-24 ENCOUNTER — Telehealth: Payer: Self-pay | Admitting: Cardiovascular Disease

## 2017-04-24 MED ORDER — PANTOPRAZOLE SODIUM 40 MG PO TBEC: 40.0000 mg | DELAYED_RELEASE_TABLET | Freq: Every day | ORAL | 11 refills | Status: DC

## 2017-04-24 NOTE — Telephone Encounter (Signed)
Spoke with pt he stated he will be having a wisdom tooth pull soon, a do not know when he stated probably in a month or two, he wanted to know if he will need to be off Plavix and how long/

## 2017-04-24 NOTE — Telephone Encounter (Signed)
Spoke with pt wife(DPR) Omeprazole is no longer covered by insurance and want to know if you can prescribed something else, I asked if pt is will to try OTC prilosec she stated pt try that before and it didn't work.

## 2017-04-24 NOTE — Telephone Encounter (Signed)
He should stop the clopidogrel 5 days before the procedure.  No other precautions necessary. MCr.

## 2017-04-24 NOTE — Telephone Encounter (Signed)
See if pantoprazole 40 mg once daily or lansoprazole 20 mg daily are covered. MCr

## 2017-04-24 NOTE — Telephone Encounter (Signed)
Pt made aware to hold plavix for 5 days prior to dental precedure.

## 2017-04-24 NOTE — Telephone Encounter (Signed)
Spoke with pt about d/c omeprazole and start pantoprazole 40 mg daily Rx has been sent to the pharmacy electronically. Pantoprazole 40 mg #30 11 refills  Pt asked about his lab work and was informed about his result and to follow up with Dr Jeannetta NapElkins to check his hgb A1c, Lab result mailed to pt.

## 2017-04-24 NOTE — Telephone Encounter (Signed)
New message  Please call  Pt c/o medication issue:  1. Name of Medication: omeprazole (PRILOSEC) 40 MG capsule  2. How are you currently taking this medication (dosage and times per day)? As prescribed  3. Are you having a reaction (difficulty breathing--STAT)? No  4. What is your medication issue? Insurance no longer covers. Requesting new medication

## 2017-04-24 NOTE — Telephone Encounter (Signed)
Mr. Adam Hunter is returning a call about his lab results . Also he is supposed to have his wisdom tooth pulled and want to know if he needs to get off some of his blood thinners . Please call   Thanks

## 2017-04-25 ENCOUNTER — Telehealth: Payer: Self-pay | Admitting: Cardiovascular Disease

## 2017-04-25 NOTE — Telephone Encounter (Signed)
Please call,pt says his insurance denied his C-Pap equipment.Wife will give you the details.

## 2017-04-25 NOTE — Telephone Encounter (Signed)
Returned call to patient's wife no answer.LMTC. 

## 2017-04-25 NOTE — Telephone Encounter (Signed)
Returned call to patient's wife.She stated insurance denied cpap equipment.Stated cpap was ordered 6 years ago when husband had MI.Stated he has never seen sleep Dr.in office.Advised he will need to see Dr.Kelly and bring cpap machine to appointment.Scheduler will call back with appointment.

## 2017-04-25 NOTE — Telephone Encounter (Signed)
Called patient and left a VM to call back to schedule appointment for the sleep clinic on 07-10-17.

## 2017-04-29 ENCOUNTER — Telehealth: Payer: Self-pay | Admitting: Cardiovascular Disease

## 2017-04-29 NOTE — Telephone Encounter (Signed)
Called patient and LVM to call back to schedule sleep clinic appointment.

## 2017-04-30 ENCOUNTER — Telehealth: Payer: Self-pay | Admitting: Cardiovascular Disease

## 2017-04-30 NOTE — Telephone Encounter (Signed)
Called patient and LVM with sleep clinic appointment on 07-10-17 at 11:20.

## 2017-05-06 ENCOUNTER — Telehealth: Payer: Self-pay | Admitting: Cardiovascular Disease

## 2017-05-06 NOTE — Telephone Encounter (Signed)
New message    Pt c/o medication issue:  1. Name of Medication: pantoprazole (PROTONIX) 40 MG tablet  2. How are you currently taking this medication (dosage and times per day)? As prescribed  3. Are you having a reaction (difficulty breathing--STAT)? no  4. What is your medication issue? Patient does not want to continue on medication. Wants to take omeprazole instead.

## 2017-05-06 NOTE — Telephone Encounter (Signed)
Omeprazole not recommended at this time due to interaction with clopidogrel (Plavix).  Omeprazole may decrease efficacy of clopidogrel.   May change to ranitidine 150mg  daily if pantoprazole not working for him or if experiencing adverse effects.

## 2017-05-06 NOTE — Telephone Encounter (Signed)
lmtcb

## 2017-05-09 ENCOUNTER — Other Ambulatory Visit: Payer: Self-pay | Admitting: Cardiovascular Disease

## 2017-05-09 MED ORDER — OMEPRAZOLE 40 MG PO CPDR: 40.0000 mg | DELAYED_RELEASE_CAPSULE | Freq: Every day | ORAL | 1 refills | Status: DC

## 2017-05-09 NOTE — Telephone Encounter (Signed)
Wife aware of MD recommendations. Rx(s) sent to pharmacy electronically.

## 2017-05-09 NOTE — Telephone Encounter (Signed)
Follow up    Patients wife Marylene Landngela is calling back about omeprazole. Please call

## 2017-05-09 NOTE — Telephone Encounter (Signed)
Returned call to patient's wife. [facility]. She states patient's reflux symptoms are not controlled with pantoprazole. She states he was on omeprazole in the past (while also on plavix) and was changed d/t insurance coverage but not omeprazole is more affordable for then. Explained the interaction w/omeprazole and plavix and rationale for not prescribing but wife is requesting that this be reviewed again by MD/RPH.   Routed as requested

## 2017-05-09 NOTE — Telephone Encounter (Signed)
Rx has been sent to the pharmacy electronically. ° °

## 2017-05-09 NOTE — Telephone Encounter (Signed)
It's been so long since his acute event that the plavix is no longer critical. OK to switch back to omeprazole if he prefers it. MCr

## 2017-06-06 ENCOUNTER — Other Ambulatory Visit: Payer: Self-pay | Admitting: Cardiovascular Disease

## 2017-06-06 NOTE — Telephone Encounter (Signed)
REFILL 

## 2017-07-02 ENCOUNTER — Other Ambulatory Visit: Payer: Self-pay | Admitting: Cardiovascular Disease

## 2017-07-02 DIAGNOSIS — G4733 Obstructive sleep apnea (adult) (pediatric): Secondary | ICD-10-CM

## 2017-07-10 ENCOUNTER — Other Ambulatory Visit: Payer: Self-pay | Admitting: Cardiovascular Disease

## 2017-07-10 ENCOUNTER — Ambulatory Visit (INDEPENDENT_AMBULATORY_CARE_PROVIDER_SITE_OTHER): Payer: BLUE CROSS/BLUE SHIELD | Admitting: Cardiovascular Disease

## 2017-07-10 ENCOUNTER — Encounter: Payer: Self-pay | Admitting: Cardiovascular Disease

## 2017-07-10 VITALS — BP 108/80 | HR 68 | Ht 72.0 in | Wt 274.0 lb

## 2017-07-10 DIAGNOSIS — G4733 Obstructive sleep apnea (adult) (pediatric): Secondary | ICD-10-CM | POA: Diagnosis not present

## 2017-07-10 DIAGNOSIS — I2510 Atherosclerotic heart disease of native coronary artery without angina pectoris: Secondary | ICD-10-CM | POA: Diagnosis not present

## 2017-07-10 DIAGNOSIS — E668 Other obesity: Secondary | ICD-10-CM | POA: Diagnosis not present

## 2017-07-10 NOTE — Progress Notes (Signed)
Cardiology Office Note    Date:  07/10/2017   ID:  CALIX HEINBAUGH, DOB 1960/12/10, MRN [id number]  PCP:  Leonard Downing, MD  Cardiologist:  Shelva Majestic, MD (sleep); Dr. Orene Desanctis  Sleep Evaluation  History of Present Illness:  Adam Hunter is a 57 y.o. male who is referred for sleep clinic evaluation. He has been on CPAP therapy since 2011.  Adam Hunter has a history of severe sleep apnea diagnosed in 2011. In 2011 he suffered an acute ST segment elevation MI secondary to occlusion of the large septal branch of his LAD. During that hospitalization, it became apparent that he most likely had obstructive sleep apnea.  He underwent a diagnostic polysomnogram.  At the [facility] on 03/02/2010 which revealed an AHI of 82.2 per hour.  He had severe oxygen desaturation to 61% with non-REM and 54% with rems sleep.  There was loud snoring.  He underwent subsequent CPAP titration on 03/16/2010 titrated up to 16 cm water pressure.  He noticed a dramatic difference once he started CPAP therapy.  His previous excessive daytime sleepiness resolved.  In addition, he had previously been waking up 3-5 times per night for urination and this essentially resolved with treatment.  He admits to 100% compliance.  He now sees Dr. Sallyanne Kuster for his primary cardiology care. He is history of hypertension, hyperlipidemia, and following smoking cessation.  Had steadily gained weight and is now severely obese. [name]  He has not been evaluated for his sleep apnea in over 7+ years.  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  Interrogation of his machine reveals excellent compliance.  He is set at a 17 cm pressure with 0 g time.  He is averaging 7 hours and 12 minutes of sleep per night with CPAP use.  There is no significant leak.  His 7 and 30 day AHI average was 3.3/h and 3.4/h. [name]  He presents for evaluation  Epworth Sleepiness Scale was recalculated the office today and  this endorsed at 4, arguing against residual daytime sleepiness.  Past Medical History:  Diagnosis Date  . CAD (coronary artery disease) 11/25/2012   STEMI October 2011 secondary to occlusion of a large septal branch of the LAD artery treated medically  . Emphysema   . Heart attack ([place]) 01/2010   non-STEMI  . Hyperlipidemia   . Hyperlipidemia 11/25/2012  . Hypertension   . OSA on CPAP     Past Surgical History:  Procedure Laterality Date  . CARDIAC CATHETERIZATION  01/20/2010   Diffuse coronary atherosclerosis w/coronary ectasia,totalled first septal perforator. non-obstructive LAD/diagonal,left CX and RCA stenosis  . [place]  . NM MYOCAR PERF WALL MOTION  05/22/2010   normal,EF 53%.  . US ECHOCARDIOGRAPHY  01/20/2010   mod. LVH,grade I diastolic dysfunction,mild AI,MR,MV annular ca+    Current Medications: Outpatient Medications Prior to Visit  Medication Sig Dispense Refill  . aspirin EC 81 MG tablet Take 1 tablet (81 mg total) by mouth daily. 90 tablet 3  . atorvastatin (LIPITOR) 10 MG tablet TAKE 1 TABLET BY MOUTH EACH NIGHT AT BEDTIME 30 tablet 11  . clopidogrel (PLAVIX) 75 MG tablet TAKE 1 TABLET BY MOUTH DAILY 30 tablet 11  . hydrochlorothiazide (MICROZIDE) 12.5 MG capsule TAKE 1 CAPSULE BY MOUTH DAILY 90 capsule 2  . losartan (COZAAR) 100 MG tablet TAKE 1 TABLET BY MOUTH ONCE DAILY 30 tablet 11  . metoprolol succinate (  TOPROL-XL) 100 MG 24 hr tablet TAKE 1 TABLET BY MOUTH DAILY WITH OR IMMEDIATELY FOLLOWING A MEAL 30 tablet 11  . omeprazole (PRILOSEC) 40 MG capsule Take 1 capsule (40 mg total) by mouth daily. 90 capsule 1  . sildenafil (VIAGRA) 50 MG tablet Take 1 tablet (50 mg total) by mouth daily as needed for erectile dysfunction. 10 tablet 3   No facility-administered medications prior to visit.      Allergies:   Patient has no known allergies.   Social History   Socioeconomic History  . Marital status: Married    Spouse name: Not on file  . Number  of children: Not on file  . Years of education: Not on file  . Highest education level: Not on file  Occupational History  . Not on file  Social Needs  . Financial resource strain: Not on file  . Food insecurity:    Worry: Not on file    Inability: Not on file  . Transportation needs:    Medical: Not on file    Non-medical: Not on file  Tobacco Use  . Smoking status: Former Smoker    Packs/day: 1.50    Years: 30.00    Pack years: 45.00    Last attempt to quit: 01/22/2010    Years since quitting: 7.4  . Smokeless tobacco: Former Network engineer and Sexual Activity  . Alcohol use: Yes    Comment: social  . Drug use: No  . Sexual activity: Not on file  Lifestyle  . Physical activity:    Days per week: Not on file    Minutes per session: Not on file  . Stress: Not on file  Relationships  . Social connections:    Talks on phone: Not on file    Gets together: Not on file    Attends religious service: Not on file    Active member of club or organization: Not on file    Attends meetings of clubs or organizations: Not on file    Relationship status: Not on file  Other Topics Concern  . 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  He is a [place].  Family History:  The patient's family history includes Cancer in his maternal grandfather; Heart attack in his father; Heart disease in his father; Hypertension in his father and mother.  Distal family history is notable in that his father is 34 ans has had CABG revascularization surgery and  prior heart attack.  His mother is age 21.  He has one sister.    ROS General: Negative; No fevers, chills, or night sweats;  HEENT: Negative; No changes in vision or hearing, sinus congestion, difficulty swallowing Pulmonary: Negative; No cough, wheezing,  shortness of breath, hemoptysis Cardiovascular: Negative; No chest pain, presyncope, syncope, palpitations GI: Negative; No nausea, vomiting, diarrhea, or abdominal pain GU: Negative; No dysuria, hematuria, or difficulty voiding Musculoskeletal: Negative; no myalgias, joint pain, or weakness Hematologic/Oncology: Negative; no easy bruising, bleeding Endocrine: Negative; no heat/cold intolerance; no diabetes Neuro: Negative; no changes in balance, headaches Skin: Negative; No rashes or skin lesions Psychiatric: Negative; No behavioral problems, depression Sleep: .  Positive for severe OSA, on CPAP therapy since 2011.  No residual snoring, daytime sleepiness, hypersomnolence, bruxism, restless legs, hypnogognic hallucinations, no  cataplexy Other comprehensive 14 point system review is negative.   PHYSICAL EXAM:   VS:  BP 108/80 (BP Location: Left Arm, Patient Position: Sitting, Cuff Size: Large)   Pulse 68   Ht 6' (1.829 m)   Wt 274 lb (124.3 kg)   BMI 37.16 kg/m     Wt Readings from Last 3 Encounters:  07/10/17 274 lb (124.3 kg)  04/22/17 277 lb (125.6 kg)  04/19/16 279 lb (126.6 kg)    General: Alert, oriented, no distress.  Skin: normal turgor, no rashes, warm and dry HEENT: Normocephalic, atraumatic. Pupils equal round and reactive to light; sclera anicteric; extraocular muscles intact; Fundi .  No hemorrhages or exudates.  Disc flat Nose without nasal septal hypertrophy Mouth/Parynx benign; Mallinpatti scale Neck: Very thick neck.approximate 19-20 cm; No JVD, no carotid bruits; normal carotid upstroke Lungs: clear to ausculatation and percussion; no wheezing or rales Chest wall: without tenderness to palpitation Heart: PMI not displaced, RRR, s1 s2 normal, 1/6 systolic murmur, no diastolic murmur, no rubs, gallops, thrills, or heaves Abdomen: soft, nontender; no hepatosplenomehaly, BS+; abdominal aorta nontender and not dilated by palpation. Back: no CVA tenderness Pulses  2+ Musculoskeletal: full range of motion, normal strength, no joint deformities Extremities: no clubbing cyanosis or edema, Homan's sign negative  Neurologic: grossly nonfocal; Cranial nerves grossly wnl Psychologic: Normal mood and affect   Studies/Labs Reviewed:   EKG:  EKG is ordered today.  ECG (independently read by me): normal sinus rhythm at 68 bpm..  Normal intervals.  Probably lead switch V1 and V3.  Recent Labs: BMP Latest Ref Rng & Units 04/22/2017 05/16/2016 07/04/2015  Glucose 65 - 99 mg/dL 130(H) 109(H) 115(H)  BUN 6 - 24 mg/dL _0 Creatinine 0.76 - 1.27 mg/dL 1.06 1.24 1.05  BUN/Creat Ratio 9 - 20 14 - -  Sodium 134 - 144 mmol/L 141 140 140  Potassium 3.5 - 5.2 mmol/L 3.8 3.8 3.9  Chloride 96 - 106 mmol/L 99 100 100  CO2 20 - 29 mmol/L _1 Calcium 8.7 - 10.2 mg/dL 9.3 9.4 9.3     Hepatic Function Latest Ref Rng & Units 04/22/2017 05/16/2016 07/04/2015  Total Protein 6.0 - 8.5 g/dL 7.5 7.7 7.2  Albumin 3.5 - 5.5 g/dL 4.5 4.3 4.2  AST 0 - 40 IU/L 30 32 21  ALT 0 - 44 IU/L 40 40 28  Alk Phosphatase 39 - 117 IU/L 79 71 65  Total Bilirubin 0.0 - 1.2 mg/dL 0.6 0.8 0.6    CBC Latest Ref Rng & Units 04/09/2012 01/22/2010 01/21/2010  WBC 3.8 - 10.6 x10 3/mm 3 9.2 10.6(H) 11.5(H)  Hemoglobin 13.0 - 18.0 g/dL 15.2 16.6 16.5  Hematocrit 40.0 - 52.0 % 45.2 48.9 48.1  Platelets 150 - 440 x10 3/mm 3 250 218 237   Lab Results  Component Value Date   MCV 89 04/09/2012   MCV 93.7 01/22/2010   MCV 93.2 01/21/2010   Lab Results  Component Value Date   TSH 3.267 01/20/2010   Lab Results  Component Value Date   HGBA1C (H) 01/20/2010    6.0 (NOTE)  According to the ADA Clinical Practice Recommendations for 2011, when HbA1c is used as a screening test:   >=6.5%   Diagnostic of Diabetes Mellitus           (if abnormal result  is confirmed)  5.7-6.4%   Increased risk of developing Diabetes Mellitus   References:Diagnosis and Classification of Diabetes Mellitus,Diabetes DIYM,4158,30(NMMHW 1):S62-S69 and Standards of Medical Care in         Diabetes - 2011,Diabetes KGSU,1103,15  (Suppl 1):S11-S61.     BNP No results found for: BNP  ProBNP    Component Value Date/Time   PROBNP 55.0 01/20/2010 0800     Lipid Panel     Component Value Date/Time   CHOL 129 04/22/2017 1046   TRIG 134 04/22/2017 1046   HDL 27 (L) 04/22/2017 1046   CHOLHDL 4.8 04/22/2017 1046   CHOLHDL 5.4 (H) 05/16/2016 1022   VLDL 38 (H) 05/16/2016 1022   LDLCALC 75 04/22/2017 1046     RADIOLOGY: No results found.   Additional studies/ records that were reviewed today include:  I reviewed his diagnostic polysomnogram and CPAP titration studies from 2011.  I reviewed office records from Dr. Sallyanne Kuster.    ASSESSMENT:    No diagnosis found.   PLAN:  Mr. Adam Hunter is a 57 year old Caucasian male who suffered an acute coronary syndrome in October 2011.  Shortly thereafter was diagnosed as having severe sleep apnea with an AHI of 82.2 per hour and significant oxygen desaturation to a nadir of 61% in non-REM and 54% in REM sleep. He has been on CPAP therapy for the past 8 years.  He cannot sleep without it.  His machine is now 57 years old.  I reviewed his data from his CPAP machine today which reveals continued excellent compliance.  AHI is 3.3 over the past 7 days and 3.4 per hour over the past month.  He qualifies for new machine and I read a prescription for a ResMed AirSensec10 CPAP machine.  At his current pressure of 17 cm he continues to do exceptionally well.  We discussed the importance of weight loss with a BMI of 37.16, as well as increased exercise.  His DME company is [facility].  Following his new machine, I will see him within 90 days per requirements to assess therapy and continued compliance.   Medication Adjustments/Labs and Tests Ordered: Current medicines are reviewed at length with  the patient today.  Concerns regarding medicines are outlined above.  Medication changes, Labs and Tests ordered today are listed in the Patient Instructions below. Patient Instructions  Follow-Up: 3 months with Dr. Claiborne Billings (sleep clinic)        Signed, Shelva Majestic, MD  07/10/2017 12:30 PM    [place] [facility] [place] [address] [place], [place], [place]  [address] Phone: [phone]

## 2017-07-10 NOTE — Patient Instructions (Signed)
Follow-Up: 3 months with Dr. Kelly (sleep clinic)     

## 2017-07-17 ENCOUNTER — Other Ambulatory Visit: Payer: Self-pay | Admitting: Cardiovascular Disease

## 2017-09-24 ENCOUNTER — Encounter: Payer: Self-pay | Admitting: Cardiovascular Disease

## 2017-09-24 ENCOUNTER — Ambulatory Visit (INDEPENDENT_AMBULATORY_CARE_PROVIDER_SITE_OTHER): Payer: BLUE CROSS/BLUE SHIELD | Admitting: Cardiovascular Disease

## 2017-09-24 VITALS — BP 118/76 | HR 62 | Ht 72.0 in | Wt 282.0 lb

## 2017-09-24 DIAGNOSIS — E785 Hyperlipidemia, unspecified: Secondary | ICD-10-CM

## 2017-09-24 DIAGNOSIS — G4733 Obstructive sleep apnea (adult) (pediatric): Secondary | ICD-10-CM | POA: Diagnosis not present

## 2017-09-24 DIAGNOSIS — N522 Drug-induced erectile dysfunction: Secondary | ICD-10-CM | POA: Diagnosis not present

## 2017-09-24 NOTE — Patient Instructions (Signed)
Follow-Up: As needed with Dr. Tresa EndoKelly (sleep)

## 2017-09-24 NOTE — Progress Notes (Signed)
Cardiology Office Note    Date:  09/26/2017   ID:  Adam Hunter, DOB 1960-06-02, MRN [id number]  PCP:  Leonard Downing, MD  Cardiologist:  Shelva Majestic, MD (sleep); Dr. Orene Desanctis  F/U Sleep Evaluation  History of Present Illness:  Adam Hunter is a 57 y.o. male who is referred for sleep clinic evaluation. He has been on CPAP therapy since 2011.  Mr. Adam Hunter has a history of severe sleep apnea diagnosed in 2011. In 2011 he suffered an acute ST segment elevation MI secondary to occlusion of the large septal branch of his LAD. During that hospitalization, it became apparent that he most likely had obstructive sleep apnea.  He underwent a diagnostic polysomnogram.  At the [facility] sleep [facility] on 03/02/2010 which revealed an AHI of 82.2 per hour.  He had severe oxygen desaturation to 61% with non-REM and 54% with rems sleep.  There was loud snoring.  He underwent subsequent CPAP titration on 03/16/2010 titrated up to 16 cm water pressure.  He noticed a dramatic difference once he started CPAP therapy.  His previous excessive daytime sleepiness resolved.  In addition, he had previously been waking up 3-5 times per night for urination and this essentially resolved with treatment.  He admits to 100% compliance.  He now sees Dr. Sallyanne Kuster for his primary cardiology care. He is history of hypertension, hyperlipidemia, and following smoking cessation.  Had steadily gained weight and is now severely obese. [name]  He has not been evaluated for his sleep apnea in over 7+ years.  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  Interrogation of his machine reveals excellent compliance.  He is set at a 17 cm pressure with 0 g time.  He is averaging 7 hours and 12 minutes of sleep per night with CPAP use.  There is no significant leak.  His 7 and 30 day AHI average was 3.3/h and 3.4/h.  An Epworth Sleepiness Scale was recalculated the office and this endorsed at 4, arguing  against residual daytime sleepiness.  When I saw him in March 2019, qualified for a new machine.  A new ResMed air sense 10 CPAP machine [facility] with a set up date of August 04, 2017.  Office today new download was obtained from Aug 24, 2017 through September 22, 2017.  He is 100% compliant.  Average sleep duration is 7 hours and 23 minutes.  At a 17 cm water pressure, AHI is excellent at 2.9.  There is no significant leak.  Epworth Sleepiness Scale score was recalculated in the office today and this endorsed at 1.  He notices significant improvement with his new machine.  It is quieter.  He is sleeping better.  He presents for reevaluation.  Past Medical History:  Diagnosis Date  . CAD (coronary artery disease) 11/25/2012   STEMI October 2011 secondary to occlusion of a large septal branch of the LAD artery treated medically  . Emphysema   . Heart attack ([place]) 01/2010   non-STEMI  . Hyperlipidemia   . Hyperlipidemia 11/25/2012  . Hypertension   . OSA on CPAP     Past Surgical History:  Procedure Laterality Date  . CARDIAC CATHETERIZATION  01/20/2010   Diffuse coronary atherosclerosis w/coronary ectasia,totalled first septal perforator. non-obstructive LAD/diagonal,left CX and RCA stenosis  . [place]  . NM MYOCAR PERF WALL MOTION  05/22/2010   normal,EF 53%.  . US ECHOCARDIOGRAPHY  01/20/2010   mod. LVH,grade I diastolic dysfunction,mild AI,MR,MV annular ca+    Current Medications: Outpatient Medications Prior to Visit  Medication Sig Dispense Refill  . aspirin EC 81 MG tablet Take 1 tablet (81 mg total) by mouth daily. 90 tablet 3  . atorvastatin (LIPITOR) 10 MG tablet TAKE 1 TABLET BY MOUTH EACH NIGHT AT BEDTIME 30 tablet 11  . clopidogrel (PLAVIX) 75 MG tablet TAKE 1 TABLET BY MOUTH DAILY 30 tablet 11  . hydrochlorothiazide (MICROZIDE) 12.5 MG capsule TAKE 1 CAPSULE BY MOUTH DAILY 90 capsule 2  . losartan (COZAAR) 100 MG tablet TAKE 1 TABLET BY MOUTH ONCE DAILY  30 tablet 11  . metoprolol succinate (TOPROL-XL) 100 MG 24 hr tablet TAKE 1 TABLET BY MOUTH DAILY WITH OR IMMEDIATELY FOLLOWING A MEAL 30 tablet 11  . omeprazole (PRILOSEC) 40 MG capsule Take 1 capsule (40 mg total) by mouth daily. 90 capsule 1  . sildenafil (VIAGRA) 50 MG tablet Take 1 tablet (50 mg total) by mouth daily as needed for erectile dysfunction. 10 tablet 3   No facility-administered medications prior to visit.      Allergies:   Patient has no known allergies.   Social History   Socioeconomic History  . Marital status: Married    Spouse name: Not on file  . Number of children: Not on file  . Years of education: Not on file  . Highest education level: Not on file  Occupational History  . Not on file  Social Needs  . Financial resource strain: Not on file  . Food insecurity:    Worry: Not on file    Inability: Not on file  . Transportation needs:    Medical: Not on file    Non-medical: Not on file  Tobacco Use  . Smoking status: Former Smoker    Packs/day: 1.50    Years: 30.00    Pack years: 45.00    Last attempt to quit: 01/22/2010    Years since quitting: 7.6  . Smokeless tobacco: Former Network engineer and Sexual Activity  . Alcohol use: Yes    Comment: social  . Drug use: No  . Sexual activity: Not on file  Lifestyle  . Physical activity:    Days per week: Not on file    Minutes per session: Not on file  . Stress: Not on file  Relationships  . Social connections:    Talks on phone: Not on file    Gets together: Not on file    Attends religious service: Not on file    Active member of club or organization: Not on file    Attends meetings of clubs or organizations: Not on file    Relationship status: Not on file  Other Topics Concern  . 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  He is a [place].  Family History:  The patient's family history includes Cancer in his maternal grandfather; Heart attack in his father; Heart disease in his father; Hypertension in his father and mother.  Distal family history is notable in that his father is 65 ans has had CABG revascularization surgery and  prior heart attack.  His mother is age 77.  He has one sister.    ROS General: Negative; No fevers,  chills, or night sweats;  HEENT: Negative; No changes in vision or hearing, sinus congestion, difficulty swallowing Pulmonary: Negative; No cough, wheezing, shortness of breath, hemoptysis Cardiovascular: Negative; No chest pain, presyncope, syncope, palpitations GI: Negative; No nausea, vomiting, diarrhea, or abdominal pain GU: Negative; No dysuria, hematuria, or difficulty voiding Musculoskeletal: Negative; no myalgias, joint pain, or weakness Hematologic/Oncology: Negative; no easy bruising, bleeding Endocrine: Negative; no heat/cold intolerance; no diabetes Neuro: Negative; no changes in balance, headaches Skin: Negative; No rashes or skin lesions Psychiatric: Negative; No behavioral problems, depression Sleep: .  Positive for severe OSA, on CPAP therapy since 2011.  No residual snoring, daytime sleepiness, hypersomnolence, bruxism, restless legs, hypnogognic hallucinations, no cataplexy Other comprehensive 14 point system review is negative.   PHYSICAL EXAM:   VS:  BP 118/76 (BP Location: Left Arm, Patient Position: Sitting, Cuff Size: Large)   Pulse 62   Ht 6' (1.829 m)   Wt 282 lb (127.9 kg)   BMI 38.25 kg/m     Repeat blood pressure by me was 112/70  Wt Readings from Last 3 Encounters:  09/24/17 282 lb (127.9 kg)  07/10/17 274 lb (124.3 kg)  04/22/17 277 lb (125.6 kg)    General: Alert, oriented, no distress.  Skin: normal turgor, no rashes, warm and dry HEENT: Normocephalic, atraumatic. Pupils equal round and reactive to light; sclera anicteric;  extraocular muscles intact;  Nose without nasal septal hypertrophy Mouth/Parynx benign; Mallinpatti scale 3/4 Neck: Very thick neck approximate size at least 19 to 20 cm.  No JVD, no carotid bruits; normal carotid upstroke Lungs: clear to ausculatation and percussion; no wheezing or rales Chest wall: without tenderness to palpitation Heart: PMI not displaced, RRR, s1 s2 normal, 1/6 systolic murmur, no diastolic murmur, no rubs, gallops, thrills, or heaves Abdomen: soft, nontender; no hepatosplenomehaly, BS+; abdominal aorta nontender and not dilated by palpation. Back: no CVA tenderness Pulses 2+ Musculoskeletal: full range of motion, normal strength, no joint deformities Extremities: no clubbing cyanosis or edema, Homan's sign negative  Neurologic: grossly nonfocal; Cranial nerves grossly wnl Psychologic: Normal mood and affect   Studies/Labs Reviewed:   July 10, 2017 EKG:  (independently read by me): normal sinus rhythm at 68 bpm..  Normal intervals.  Probably lead switch V1 and V3.  Recent Labs: BMP Latest Ref Rng & Units 04/22/2017 05/16/2016 07/04/2015  Glucose 65 - 99 mg/dL 130(H) 109(H) 115(H)  BUN 6 - 24 mg/dL _0 Creatinine 0.76 - 1.27 mg/dL 1.06 1.24 1.05  BUN/Creat Ratio 9 - 20 14 - -  Sodium 134 - 144 mmol/L 141 140 140  Potassium 3.5 - 5.2 mmol/L 3.8 3.8 3.9  Chloride 96 - 106 mmol/L 99 100 100  CO2 20 - 29 mmol/L _1 Calcium 8.7 - 10.2 mg/dL 9.3 9.4 9.3     Hepatic Function Latest Ref Rng & Units 04/22/2017 05/16/2016 07/04/2015  Total Protein 6.0 - 8.5 g/dL 7.5 7.7 7.2  Albumin 3.5 - 5.5 g/dL 4.5 4.3 4.2  AST 0 - 40 IU/L 30 32 21  ALT 0 - 44 IU/L 40 40 28  Alk Phosphatase 39 - 117 IU/L 79 71 65  Total Bilirubin 0.0 - 1.2 mg/dL 0.6 0.8 0.6    CBC Latest Ref Rng & Units 04/09/2012 01/22/2010 01/21/2010  WBC 3.8 - 10.6 x10 3/mm 3 9.2 10.6(H) 11.5(H)  Hemoglobin 13.0 - 18.0 g/dL 15.2 16.6 16.5  Hematocrit 40.0 - 52.0 % 45.2 48.9 48.1  Platelets 150 - 440  x10  3/mm 3 250 218 237   Lab Results  Component Value Date   MCV 89 04/09/2012   MCV 93.7 01/22/2010   MCV 93.2 01/21/2010   Lab Results  Component Value Date   TSH 3.267 01/20/2010   Lab Results  Component Value Date   HGBA1C (H) 01/20/2010    6.0 (NOTE)                                                                       According to the ADA Clinical Practice Recommendations for 2011, when HbA1c is used as a screening test:   >=6.5%   Diagnostic of Diabetes Mellitus           (if abnormal result  is confirmed)  5.7-6.4%   Increased risk of developing Diabetes Mellitus  References:Diagnosis and Classification of Diabetes Mellitus,Diabetes KGMW,1027,25(DGUYQ 1):S62-S69 and Standards of Medical Care in         Diabetes - 2011,Diabetes Care,2011,34  (Suppl 1):S11-S61.     BNP No results found for: BNP  ProBNP    Component Value Date/Time   PROBNP 55.0 01/20/2010 0800     Lipid Panel     Component Value Date/Time   CHOL 129 04/22/2017 1046   TRIG 134 04/22/2017 1046   HDL 27 (L) 04/22/2017 1046   CHOLHDL 4.8 04/22/2017 1046   CHOLHDL 5.4 (H) 05/16/2016 1022   VLDL 38 (H) 05/16/2016 1022   LDLCALC 75 04/22/2017 1046     RADIOLOGY: No results found.   Additional studies/ records that were reviewed today include:  I reviewed his diagnostic polysomnogram and CPAP titration studies from 2011.  I reviewed office records from Dr. Sallyanne Kuster.  I generated a new download in the office today from Aug 24, 2017 through September 22, 2017.  ASSESSMENT:    1. Obstructive sleep apnea   2. Morbid obesity ([place])   3. Dyslipidemia   4. Drug-induced erectile dysfunction     PLAN:  Mr. Eldrick Penick is a 57 year old Caucasian male who suffered an acute coronary syndrome in October 2011.  Shortly thereafter was diagnosed as having severe sleep apnea with an AHI of 82.2 per hour and significant oxygen desaturation to a nadir of 61% in non-REM and 54% in REM sleep. He has been on CPAP  therapy for the past 8 years.  He cannot sleep without it.  Since I last saw him, he qualified for a new machine and received a ResMed air sense 10 CPAP unit with a set up date of August 04, 2017.  He is set at 17 cm water pressure.  On present download, AHI is excellent at 2.9.  He continues to show 100% compliance.  There is no mask leak.  He denies any breakthrough snoring.  His Epworth Sleepiness Scale score is 1.  His blood pressure today is well controlled on current therapy including losartan 100 mg daily, HCTZ 12.5 mg, and Toprol-XL 100 mg.  He continues to be on atorvastatin 10 mg for hyperlipidemia.  He is on sildenafil for improved erectile function.  He will continue current therapy.  We discussed weight loss with his BMI of 38.25 and increased exercise.  He will follow-up with Dr. Sallyanne Kuster for his cardiology care.  I will see  him on an as-needed basis from a sleep perspective.    Medication Adjustments/Labs and Tests Ordered: Current medicines are reviewed at length with the patient today.  Concerns regarding medicines are outlined above.  Medication changes, Labs and Tests ordered today are listed in the Patient Instructions below. Patient Instructions  Follow-Up: As needed with Dr. Claiborne Billings (sleep)        Signed, Shelva Majestic, MD  09/26/2017 1:58 PM    [facility] [address], [place], [place], [place]  [address] Phone: [phone]

## 2017-09-26 ENCOUNTER — Encounter: Payer: Self-pay | Admitting: Cardiovascular Disease

## 2017-10-06 ENCOUNTER — Encounter: Payer: Self-pay | Admitting: Cardiovascular Disease

## 2017-11-27 ENCOUNTER — Other Ambulatory Visit: Payer: Self-pay | Admitting: Cardiovascular Disease

## 2017-12-29 ENCOUNTER — Other Ambulatory Visit: Payer: Self-pay | Admitting: Cardiovascular Disease

## 2018-05-08 ENCOUNTER — Other Ambulatory Visit: Payer: Self-pay | Admitting: Cardiovascular Disease

## 2018-05-27 ENCOUNTER — Other Ambulatory Visit: Payer: Self-pay | Admitting: Cardiology

## 2018-06-26 ENCOUNTER — Telehealth: Payer: Self-pay | Admitting: Cardiovascular Disease

## 2018-06-26 MED ORDER — HYDROCHLOROTHIAZIDE 25 MG PO TABS: 25.0000 mg | ORAL_TABLET | Freq: Every day | ORAL | 3 refills | Status: DC

## 2018-06-26 NOTE — Telephone Encounter (Signed)
Pt c/o BP issue: STAT if pt c/o blurred vision, one-sided weakness or slurred speech  1. What are your last 5 BP readings? 153/91 around 3am today  2. Are you having any other symptoms (ex. Dizziness, headache, blurred vision, passed out)? Felt lightheaded yesterday after mowing the lawn.   3. What is your BP issue? Higher than normal BP. Pt is wondering if he needs a higher dosage of his medicine. Has been on same dosage for about 3 years.  Dull back pain for 2-3 days. Not sure if it is heart related or not, wants to know if needs to be seen

## 2018-06-26 NOTE — Telephone Encounter (Signed)
Spoke with patient and he feels like the pain in his back is musculoskeletal. He stated pain about a 4 out of 10. Advised to contact PCP.  His blood pressure has been elevated x 2 days, had not been taking prior to yesterday. Patient currently taking Losartan 100 mg daily, Toprol 100 mg daily, and HCTZ 12.5 mg daily. Blood pressure running around 150's/100. Will forward to Pharm D for review

## 2018-06-26 NOTE — Telephone Encounter (Signed)
Advised patient, verbalized understanding. He does have appointment with PCP this afternoon. If they do any lab work he will ask that they send to Dr [employer].

## 2018-06-26 NOTE — Telephone Encounter (Signed)
Thanks Mcr 

## 2018-06-26 NOTE — Telephone Encounter (Signed)
May increase HCTZ to 25mg  daily for better BP control.   Please stay hydrated and repeat BMET on Monday to monitor renal function.

## 2018-06-30 ENCOUNTER — Telehealth: Payer: Self-pay | Admitting: Cardiovascular Disease

## 2018-06-30 NOTE — Telephone Encounter (Signed)
F/U Message          Patient is calling back today to let Dr. Salena Saner know that he is still hurting in his back, he would like a call back concerning this matter.

## 2018-06-30 NOTE — Telephone Encounter (Signed)
I called him. Pain is non-coronary in pattern and different from previous angina. Most likely musculoskeletal, less likely pericarditic. He had ECG and cardiac enzymes w PCP and got a report that these were OK. Keep scheduled appt. MCr

## 2018-06-30 NOTE — Telephone Encounter (Signed)
Patient states that he called Friday- spoke with a nurse who advised him to be seen by PCP, he seen his PCP, they completed lab work. (states it was sent yesterday) will route to nurse and see if this was picked up already.  Patient is calling because his back pain is still an issue, he states now he does notice it in his chest area/ below his breast bone- with his history he is concerned that it is a sign. His blood pressure this morning before his medications were 140/88, he does mention on Sunday afternoon after his medications it brought his BP down to 102/75.   Patient denies SOB or swelling, only complaint is the back pain- and now the dull soreness feeling under his breast bone. Patient would like to make Dr.C aware on if he should be seen sooner or if a test should be ran. I advised patient that I would verify if labs were sent over via Dr.C's nurse, as well as reach out to Dr.C to see if he has any new recommendations. Patients next appointment is 04/07.   Please advise.  Thank you!

## 2018-07-01 NOTE — Telephone Encounter (Signed)
Noted. Thanks.

## 2018-07-04 ENCOUNTER — Other Ambulatory Visit: Payer: Self-pay | Admitting: Cardiovascular Disease

## 2018-07-08 NOTE — Telephone Encounter (Signed)
Spoke with patient and he had labs at PCP same day. Called and requested from Dr Jeannetta Nap.

## 2018-07-16 ENCOUNTER — Telehealth: Payer: Self-pay

## 2018-07-16 NOTE — Telephone Encounter (Signed)
Virtual Visit Pre-Appointment Phone Call  Steps For Call:  1. Confirm consent - "In the setting of the current Covid19 crisis, you are scheduled for a (phone or video) visit with your provider on (date) at (time).  Just as we do with many in-office visits, in order for you to participate in this visit, we must obtain consent.  If you'd like, I can send this to your mychart (if signed up) or email for you to review.  Otherwise, I can obtain your verbal consent now.  All virtual visits are billed to your insurance company just like a normal visit would be.  By agreeing to a virtual visit, we'd like you to understand that the technology does not allow for your provider to perform an examination, and thus may limit your provider's ability to fully assess your condition.  Finally, though the technology is pretty good, we cannot assure that it will always work on either your or our end, and in the setting of a video visit, we may have to convert it to a phone-only visit.  In either situation, we cannot ensure that we have a secure connection.  Are you willing to proceed?"  2. Give patient instructions for WebEx download to smartphone as below if video visit  3. Advise patient to be prepared with any vital sign or heart rhythm information, their current medicines, and a piece of paper and pen handy for any instructions they may receive the day of their visit  4. Inform patient they will receive a phone call 15 minutes prior to their appointment time (may be from unknown caller ID) so they should be prepared to answer  5. Confirm that appointment type is correct in Epic appointment notes (video vs telephone)    TELEPHONE CALL NOTE  Adam Hunter has been deemed a candidate for a follow-up tele-health visit to limit community exposure during the Covid-19 pandemic. I spoke with the patient via phone to ensure availability of phone/video source, confirm preferred email & phone number, and discuss  instructions and expectations.  I reminded Adam Hunter to be prepared with any vital sign and/or heart rhythm information that could potentially be obtained via home monitoring, at the time of his visit. I reminded Adam Hunter to expect a phone call at the time of his visit if his visit.  Did the patient verbally acknowledge consent to treatment? Patient provided verbal consent  Chana Bode, [facility] 07/16/2018 10:34 AM   DOWNLOADING THE WEBEX SOFTWARE TO SMARTPHONE  - If Apple, go to [employer] and type in WebEx in the search bar. Download [employer], the blue/Gualberto [place]ah[place] circle. The app is free but as with any other app downloads, their phone may require them to verify saved payment information or Apple password. The patient does NOT have to create an account.  - If Android, ask patient to go to [employer] and type in WebEx in the search bar. Download [employer], the blue/Meng [place] circle. The app is free but as with any other app downloads, their phone may require them to verify saved payment information or Android password. The patient does NOT have to create an account.   CONSENT FOR TELE-HEALTH VISIT - PLEASE REVIEW  I hereby voluntarily request, consent and authorize [facility] and its employed or contracted physicians, physician assistants, nurse practitioners or other licensed health care professionals (the Practitioner), to provide me with telemedicine health care services (the "Services") as deemed necessary by  the treating Practitioner. I acknowledge and consent to receive the Services by the Practitioner via telemedicine. I understand that the telemedicine visit will involve communicating with the Practitioner through live audiovisual communication technology and the disclosure of certain medical information by electronic transmission. I acknowledge that I have been given the opportunity to request an in-person assessment or other available alternative prior to the  telemedicine visit and am voluntarily participating in the telemedicine visit.  I understand that I have the right to withhold or withdraw my consent to the use of telemedicine in the course of my care at any time, without affecting my right to future care or treatment, and that the Practitioner or I may terminate the telemedicine visit at any time. I understand that I have the right to inspect all information obtained and/or recorded in the course of the telemedicine visit and may receive copies of available information for a reasonable fee.  I understand that some of the potential risks of receiving the Services via telemedicine include:  [name] Delay or interruption in medical evaluation due to technological equipment failure or disruption; . Information transmitted may not be sufficient (e.g. poor resolution of images) to allow for appropriate medical decision making by the Practitioner; and/or  . In rare instances, security protocols could fail, causing a breach of personal health information.  Furthermore, I acknowledge that it is my responsibility to provide information about my medical history, conditions and care that is complete and accurate to the best of my ability. I acknowledge that Practitioner's advice, recommendations, and/or decision may be based on factors not within their control, such as incomplete or inaccurate data provided by me or distortions of diagnostic images or specimens that may result from electronic transmissions. I understand that the practice of medicine is not an exact science and that Practitioner makes no warranties or guarantees regarding treatment outcomes. I acknowledge that I will receive a copy of this consent concurrently upon execution via email to the email address I last provided but may also request a printed copy by calling the office of [place].    I understand that my insurance will be billed for this visit.   I have read or had this consent read to me.  . I understand the contents of this consent, which adequately explains the benefits and risks of the Services being provided via telemedicine.  . I have been provided ample opportunity to ask questions regarding this consent and the Services and have had my questions answered to my satisfaction. . I give my informed consent for the services to be provided through the use of telemedicine in my medical care  By participating in this telemedicine visit I agree to the above.

## 2018-07-21 ENCOUNTER — Telehealth: Payer: Self-pay | Admitting: *Deleted

## 2018-07-21 ENCOUNTER — Encounter: Payer: Self-pay | Admitting: Cardiovascular Disease

## 2018-07-21 ENCOUNTER — Telehealth (INDEPENDENT_AMBULATORY_CARE_PROVIDER_SITE_OTHER): Payer: BLUE CROSS/BLUE SHIELD | Admitting: Cardiovascular Disease

## 2018-07-21 VITALS — BP 136/90 | HR 82

## 2018-07-21 DIAGNOSIS — E785 Hyperlipidemia, unspecified: Secondary | ICD-10-CM

## 2018-07-21 DIAGNOSIS — I2510 Atherosclerotic heart disease of native coronary artery without angina pectoris: Secondary | ICD-10-CM

## 2018-07-21 DIAGNOSIS — N522 Drug-induced erectile dysfunction: Secondary | ICD-10-CM

## 2018-07-21 MED ORDER — ATORVASTATIN CALCIUM 10 MG PO TABS: ORAL_TABLET | ORAL | 3 refills | Status: DC

## 2018-07-21 MED ORDER — OMEPRAZOLE 40 MG PO CPDR: 40.0000 mg | DELAYED_RELEASE_CAPSULE | Freq: Every day | ORAL | 3 refills | Status: DC

## 2018-07-21 MED ORDER — METOPROLOL SUCCINATE ER 100 MG PO TB24: ORAL_TABLET | ORAL | 3 refills | Status: DC

## 2018-07-21 MED ORDER — CLOPIDOGREL BISULFATE 75 MG PO TABS: 75.0000 mg | ORAL_TABLET | Freq: Every day | ORAL | 3 refills | Status: DC

## 2018-07-21 MED ORDER — LOSARTAN POTASSIUM 100 MG PO TABS: 100.0000 mg | ORAL_TABLET | Freq: Every day | ORAL | 3 refills | Status: DC

## 2018-07-21 MED ORDER — HYDROCHLOROTHIAZIDE 25 MG PO TABS: 25.0000 mg | ORAL_TABLET | Freq: Every day | ORAL | 3 refills | Status: DC

## 2018-07-21 NOTE — Progress Notes (Signed)
Virtual Visit via Video Note   This visit type was conducted due to national recommendations for restrictions regarding the COVID-19 Pandemic (e.g. social distancing) in an effort to limit this patient's exposure and mitigate transmission in our community.  Due to his co-morbid illnesses, this patient is at least at moderate risk for complications without adequate follow up.  This format is felt to be most appropriate for this patient at this time.  All issues noted in this document were discussed and addressed.  A limited physical exam was performed with this format.  Please refer to the patient's chart for his consent to telehealth for [facility].   Evaluation Performed:  Follow-up visit  Date:  07/21/2018   ID:  Adam Hunter, DOB 06/30/1960, MRN [id number]  Patient Location: Home  Provider Location: Office  PCP:  Kaleen Mask, MD  Cardiologist:  Anubis Fundora Electrophysiologist:  None   Chief Complaint:  Chest/back pain  History of Present Illness:    Adam Hunter is a 58 y.o. male who presents via audio/video conferencing for a telehealth visit today.    [name] has a history of acute ST segment elevation myocardial infarction October 2011 due to occlusion of a large septal branch of the LAD artery, treated medically.  He has not had any coronary events since that time.  Additional comorbidities include hypertension, dyslipidemia with severely decreased HDL, obstructive sleep apnea on CPAP and morbid obesity (he gained a lot of weight after stopping smoking).  About a month ago he had problems with chest pain that sounded convincingly musculoskeletal, was positional, not related to exertion and different from his previous angina pectoris.  His PCP checked his electrocardiogram and cardiac enzymes and these were reportedly normal (I have not been able to see the labs, but I concur that the ECG does not show any high risk findings).  Since that episode, he has performed more  intense physical activity, including trimming hedges with mechanical loppers and this has actually led to improvement in his pain.  He still has occasional back pain when he rolls over in bed.  The patient specifically denies any chest pain with exertion, dyspnea at rest or with exertion, orthopnea, paroxysmal nocturnal dyspnea, syncope, palpitations, focal neurological deficits, intermittent claudication, lower extremity edema, unexplained weight gain, cough, hemoptysis or wheezing.  The patient does not have symptoms concerning for COVID-19 infection (fever, chills, cough, or new shortness of breath).    Past Medical History:  Diagnosis Date  . CAD (coronary artery disease) 11/25/2012   STEMI October 2011 secondary to occlusion of a large septal branch of the LAD artery treated medically  . Emphysema   . Heart attack (HCC) 01/2010   non-STEMI  . Hyperlipidemia   . Hyperlipidemia 11/25/2012  . Hypertension   . OSA on CPAP    Past Surgical History:  Procedure Laterality Date  . CARDIAC CATHETERIZATION  01/20/2010   Diffuse coronary atherosclerosis w/coronary ectasia,totalled first septal perforator. non-obstructive LAD/diagonal,left CX and RCA stenosis  . FINGER SURGERY  1974  . NM MYOCAR PERF WALL MOTION  05/22/2010   normal,EF 53%.  . US ECHOCARDIOGRAPHY  01/20/2010   mod. LVH,grade I diastolic dysfunction,mild AI,MR,MV annular ca+     No outpatient medications have been marked as taking for the 07/21/18 encounter (Telemedicine) with Adam Hunter, Rachelle Hora, MD.     Allergies:   Patient has no known allergies.   Social History   Tobacco Use  . Smoking status: Former Smoker    Packs/day:  1.50    Years: 30.00    Pack years: 45.00    Last attempt to quit: 01/22/2010    Years since quitting: 8.4  . Smokeless tobacco: Former Engineer, waterUser  Substance Use Topics  . Alcohol use: Yes    Comment: social  . Drug use: No     Family Hx: The patient's family history includes Cancer in his maternal  grandfather; Heart attack in his father; Heart disease in his father; Hypertension in his father and mother.  ROS:   Please see the history of present illness.     All other systems reviewed and are negative.   Prior CV studies:   The following studies were reviewed today:  n/a  Labs/Other Tests and Data Reviewed:    EKG: ECG from 06/29/2018 shows mild sinus tachycardia, nonspecific repolarization changes.  Recent Labs: No results found for requested labs within last 8760 hours.   Recent Lipid Panel Lab Results  Component Value Date/Time   CHOL 129 04/22/2017 10:46 AM   TRIG 134 04/22/2017 10:46 AM   HDL 27 (L) 04/22/2017 10:46 AM   CHOLHDL 4.8 04/22/2017 10:46 AM   CHOLHDL 5.4 (H) 05/16/2016 10:22 AM   LDLCALC 75 04/22/2017 10:46 AM    Wt Readings from Last 3 Encounters:  09/24/17 282 lb (127.9 kg)  07/10/17 274 lb (124.3 kg)  04/22/17 277 lb (125.6 kg)     Objective:    Vital Signs:  BP 136/90   Pulse 82    Well nourished, well developed male in no acute distress. Obese.  ASSESSMENT & PLAN:    1. HTN: Well-controlled now.  Has minimal symptoms of orthostatic hypotension.  Slightly elevated diastolic blood pressure today, but consistently well controlled otherwise when he checks it at home.  Continue same medications. 2. CAD: Asymptomatic even when physically active.  The focus is on risk factor modification. 3. HLP: Unable to check his lipid profile this year in the setting of the coronavirus restrictions.  Has always had a stubbornly low HDL but a well-controlled LDL cholesterol.  We will send him lab requests to use when the stay-at-home order is active.  Encouraged physical activity and weight loss. 4. OSA: Reports 100% compliance and good results with her CPAP therapy.  He never even take symptomatic without it.  The only problem is dry mouth which is led to some dental issues.  Patient is modified using Biotene mouthwash.  Suggested a chinstrap. 5. ED:  Occasionally uses PDE 5 inhibitors.  Understands that should never be combined with nitrates.  COVID-19 Education: The signs and symptoms of COVID-19 were discussed with the patient and how to seek care for testing (follow up with PCP or arrange E-visit).  The importance of social distancing was discussed today.  Time:   Today, I have spent 22 minutes with the patient with telehealth technology discussing the above problems.     Medication Adjustments/Labs and Tests Ordered: Current medicines are reviewed at length with the patient today.  Concerns regarding medicines are outlined above.  Tests Ordered: No orders of the defined types were placed in this encounter.  Medication Changes: No orders of the defined types were placed in this encounter.   Disposition:  Follow up 12 months  Signed, Thurmon FairMihai Zed Wanninger, MD  07/21/2018 9:14 AM    [facility]

## 2018-07-21 NOTE — Patient Instructions (Addendum)
   Medication Instructions:  Continue same medications If you need a refill on your cardiac medications before your next appointment, please call your pharmacy.   Lab work: Fasting Lipid panel and cmet  Lab orders enclosed If you have labs (blood work) drawn today and your tests are completely normal, you will receive your results only by: [name] MyChart Message (if you have MyChart) OR . A paper copy in the mail If you have any lab test that is abnormal or we need to change your treatment, we will call you to review the results.  Testing/Procedures: None ordered  Follow-Up: At [facility], you and your health needs are our priority.  As part of our continuing mission to provide you with exceptional heart care, we have created designated Provider Care Teams.  These Care Teams include your primary Cardiologist (physician) and Advanced Practice Providers (APPs -  Physician Assistants and Nurse Practitioners) who all work together to provide you with the care you need, when you need it. . Schedule follow up with Dr.Croitoru in 12 months  Call 3 months before to schedule

## 2018-07-21 NOTE — Telephone Encounter (Signed)
Pre chart

## 2019-04-06 ENCOUNTER — Other Ambulatory Visit: Payer: Self-pay | Admitting: Cardiovascular Disease

## 2019-07-23 ENCOUNTER — Other Ambulatory Visit: Payer: Self-pay | Admitting: Cardiovascular Disease

## 2019-07-23 NOTE — Telephone Encounter (Signed)
*  STAT* If patient is at the pharmacy, call can be transferred to refill team.   1. Which medications need to be refilled? (please list name of each medication and dose if known) hydrochlorothiazide (HYDRODIURIL) 25 MG tablet hydrochlorothiazide (HYDRODIURIL) 25 MG tablet   2. Which pharmacy/location (including street and city if local pharmacy) is medication to be sent to? [facility] [place] - [address]  3. Do they need a 30 day or 90 day supply? 90 day  Patient is out of medication .

## 2019-07-27 ENCOUNTER — Other Ambulatory Visit: Payer: Self-pay | Admitting: Cardiovascular Disease

## 2019-08-03 ENCOUNTER — Ambulatory Visit (INDEPENDENT_AMBULATORY_CARE_PROVIDER_SITE_OTHER): Payer: BC Managed Care – PPO | Admitting: Cardiovascular Disease

## 2019-08-03 ENCOUNTER — Other Ambulatory Visit: Payer: Self-pay

## 2019-08-03 ENCOUNTER — Encounter: Payer: Self-pay | Admitting: Cardiovascular Disease

## 2019-08-03 VITALS — BP 116/75 | HR 70 | Temp 97.3°F | Resp 16 | Ht 72.0 in | Wt 261.0 lb

## 2019-08-03 DIAGNOSIS — E785 Hyperlipidemia, unspecified: Secondary | ICD-10-CM | POA: Diagnosis not present

## 2019-08-03 DIAGNOSIS — N522 Drug-induced erectile dysfunction: Secondary | ICD-10-CM

## 2019-08-03 DIAGNOSIS — I10 Essential (primary) hypertension: Secondary | ICD-10-CM

## 2019-08-03 DIAGNOSIS — I2510 Atherosclerotic heart disease of native coronary artery without angina pectoris: Secondary | ICD-10-CM

## 2019-08-03 DIAGNOSIS — G4733 Obstructive sleep apnea (adult) (pediatric): Secondary | ICD-10-CM

## 2019-08-03 NOTE — Progress Notes (Signed)
Cardiology office note   This visit type was conducted due to national recommendations for restrictions regarding the COVID-19 Pandemic (e.g. social distancing) in an effort to limit this patient's exposure and mitigate transmission in our community.  Due to his co-morbid illnesses, this patient is at least at moderate risk for complications without adequate follow up.  This format is felt to be most appropriate for this patient at this time.  All issues noted in this document were discussed and addressed.  A limited physical exam was performed with this format.  Please refer to the patient's chart for his consent to telehealth for [facility].   Evaluation Performed:  Follow-up visit  Date:  08/03/2019   ID:  Adam Hunter, DOB 04/27/1960, MRN [id number]  PCP:  Adam Downing, MD  Cardiologist:  Adam Hunter Electrophysiologist:  None   Chief Complaint:  CAD f/u  History of Present Illness:    Adam Hunter has a history of acute ST segment elevation myocardial infarction October 2011 due to occlusion of a large septal branch of the LAD artery, treated medically.  He has not had any coronary events since that time.  Additional comorbidities include hypertension, dyslipidemia with severely decreased HDL, obstructive sleep apnea on CPAP and morbid obesity (he gained a lot of weight after stopping smoking).  He has had a good year.  He has taken advantage of working from home during the coronavirus pandemic and is eating much healthier.  He has lost 20 pounds in the last couple of years.  The patient specifically denies any chest pain at rest exertion, dyspnea at rest or with exertion, orthopnea, paroxysmal nocturnal dyspnea, syncope, palpitations, focal neurological deficits, intermittent claudication, lower extremity edema, unexplained weight gain, cough, hemoptysis or wheezing.  Has occasional orthostatic dizziness but this is not frequent or severe.  The patient does not have symptoms  concerning for COVID-19 infection (fever, chills, cough, or new shortness of breath).    Past Medical History:  Diagnosis Date  . CAD (coronary artery disease) 11/25/2012   STEMI October 2011 secondary to occlusion of a large septal branch of the LAD artery treated medically  . Emphysema   . Heart attack ([place]) 01/2010   non-STEMI  . Hyperlipidemia   . Hyperlipidemia 11/25/2012  . Hypertension   . OSA on CPAP    Past Surgical History:  Procedure Laterality Date  . CARDIAC CATHETERIZATION  01/20/2010   Diffuse coronary atherosclerosis w/coronary ectasia,totalled first septal perforator. non-obstructive LAD/diagonal,left CX and RCA stenosis  . [place]  . NM MYOCAR PERF WALL MOTION  05/22/2010   normal,EF 53%.  . US ECHOCARDIOGRAPHY  01/20/2010   mod. LVH,grade I diastolic dysfunction,mild AI,MR,MV annular ca+     Current Meds  Medication Sig  . aspirin EC 81 MG tablet Take 1 tablet (81 mg total) by mouth daily.  [name] atorvastatin (LIPITOR) 10 MG tablet TAKE 1 TABLET BY MOUTH EACH NIGHT AT BEDTIME  . clopidogrel (PLAVIX) 75 MG tablet TAKE 1 TABLET BY MOUTH DAILY  . hydrochlorothiazide (HYDRODIURIL) 25 MG tablet TAKE 1 TABLET BY MOUTH DAILY  . losartan (COZAAR) 100 MG tablet TAKE 1 TABLET BY MOUTH DAILY  . metoprolol succinate (TOPROL-XL) 100 MG 24 hr tablet TAKE 1 TABLET BY MOUTH DAILY WITH OR IMMEDIATELY FOLLOWING A MEAL  . omeprazole (PRILOSEC) 40 MG capsule Take 1 capsule (40 mg total) by mouth daily.  . sildenafil (VIAGRA) 50 MG tablet Take 1 tablet (50 mg total) by mouth daily as needed  for erectile dysfunction.     Allergies:   Patient has no known allergies.   Social History   Tobacco Use  . Smoking status: Former Smoker    Packs/day: 1.50    Years: 30.00    Pack years: 45.00    Quit date: 01/22/2010    Years since quitting: 9.5  . Smokeless tobacco: Former Engineer, water Use Topics  . Alcohol use: Yes    Comment: social  . Drug use: No     Family  Hx: The patient's family history includes Cancer in his maternal grandfather; Heart attack in his father; Heart disease in his father; Hypertension in his father and mother.  ROS:   Please see the history of present illness.    All other systems are reviewed and are negative.  Prior CV studies:   The following studies were reviewed today:  n/a  Labs/Other Tests and Data Reviewed:    EKG: ECG is ordered today shows normal sinus rhythm, old right bundle branch block, no ischemic repolarization abnormalities.  Recent Labs: No results found for requested labs within last 8760 hours.   Recent Lipid Panel Lab Results  Component Value Date/Time   CHOL 129 04/22/2017 10:46 AM   TRIG 134 04/22/2017 10:46 AM   HDL 27 (L) 04/22/2017 10:46 AM   CHOLHDL 4.8 04/22/2017 10:46 AM   CHOLHDL 5.4 (H) 05/16/2016 10:22 AM   LDLCALC 75 04/22/2017 10:46 AM    Wt Readings from Last 3 Encounters:  08/03/19 261 lb (118.4 kg)  09/24/17 282 lb (127.9 kg)  07/10/17 274 lb (124.3 kg)     Objective:    Vital Signs:  BP 116/75   Pulse 70   Temp (!) 97.3 F (36.3 C)   Resp 16   Ht 6' (1.829 m)   Wt 261 lb (118.4 kg)   SpO2 98%   BMI 35.40 kg/m     General: Alert, oriented x3, no distress, moderately obese Head: no evidence of trauma, PERRL, EOMI, no exophtalmos or lid lag, no myxedema, no xanthelasma; normal ears, nose and oropharynx Neck: normal jugular venous pulsations and no hepatojugular reflux; brisk carotid pulses without delay and no carotid bruits Chest: clear to auscultation, no signs of consolidation by percussion or palpation, normal fremitus, symmetrical and full respiratory excursions Cardiovascular: normal position and quality of the apical impulse, regular rhythm, normal first and second heart sounds, no murmurs, rubs or gallops Abdomen: no tenderness or distention, no masses by palpation, no abnormal pulsatility or arterial bruits, normal bowel sounds, no  hepatosplenomegaly Extremities: no clubbing, cyanosis or edema; 2+ radial, ulnar and brachial pulses bilaterally; 2+ right femoral, posterior tibial and dorsalis pedis pulses; 2+ left femoral, posterior tibial and dorsalis pedis pulses; no subclavian or femoral bruits Neurological: grossly nonfocal Psych: Normal mood and affect   ASSESSMENT & PLAN:    1. Essential hypertension   2. Coronary artery disease involving native coronary artery of native heart without angina pectoris   3. Dyslipidemia (high LDL; low HDL)   4. OSA (obstructive sleep apnea)   5. Drug-induced erectile dysfunction     1. HTN: Blood pressure control is excellent.  Occasional orthostatic dizziness.  If he continues to lose weight and the dizziness worsens, would recommend cutting back the dose of hydrochlorothiazide. 2. CAD: Despite being very physically active, he does not have any angina pectoris.  Continue to focus on risk factor modification.  He is on dual antiplatelet therapy and statin as well as beta-blockers. 3.  HLP: He has dyslipidemia with low HDL and elevated LDL.  Unable to check his labs in the last couple of years.  Hopefully will see improvement in HDL with his current weight loss. 4. OSA: Reports good compliance with CPAP, which is followed by Dr. Tresa Endo via remote downloads. 5. ED: Uses sildenafil occasionally with good results.  Understands that this cannot be combined with nitrates.  COVID-19 Education: The signs and symptoms of COVID-19 were discussed with the patient and how to seek care for testing (follow up with PCP or arrange E-visit).  The importance of social distancing was discussed today.     Medication Adjustments/Labs and Tests Ordered: Current medicines are reviewed at length with the patient today.  Concerns regarding medicines are outlined above.  Tests Ordered: Orders Placed This Encounter  Procedures  . Lipid panel  . Comprehensive metabolic panel  . EKG 12-Lead   Medication  Changes: No orders of the defined types were placed in this encounter.   Disposition:  Follow up 12 months  Signed, Thurmon Fair, MD  08/03/2019 2:45 PM    [facility]

## 2019-08-03 NOTE — Patient Instructions (Signed)
Medication Instructions:  No changes *If you need a refill on your cardiac medications before your next appointment, please call your pharmacy*   Lab Work: Your provider would like for you to have the following labs today: fasting lipid and CMET  If you have labs (blood work) drawn today and your tests are completely normal, you will receive your results only by: . MyChart Message (if you have MyChart) OR . A paper copy in the mail If you have any lab test that is abnormal or we need to change your treatment, we will call you to review the results.   Testing/Procedures: None ordered   Follow-Up: At [facility], you and your health needs are our priority.  As part of our continuing mission to provide you with exceptional heart care, we have created designated Provider Care Teams.  These Care Teams include your primary Cardiologist (physician) and Advanced Practice Providers (APPs -  Physician Assistants and Nurse Practitioners) who all work together to provide you with the care you need, when you need it.  We recommend signing up for the patient portal called "MyChart".  Sign up information is provided on this After Visit Summary.  MyChart is used to connect with patients for Virtual Visits (Telemedicine).  Patients are able to view lab/test results, encounter notes, upcoming appointments, etc.  Non-urgent messages can be sent to your provider as well.   To learn more about what you can do with MyChart, go to [email or web address].    Your next appointment:   12 month(s)  The format for your next appointment:   In Person  Provider:   You may see Mihai Croitoru, MD or one of the following Advanced Practice Providers on your designated Care Team:    Hao Meng, PA-C  Angela Duke, PA-C or   Krista Kroeger, PA-C    

## 2019-08-04 LAB — COMPREHENSIVE METABOLIC PANEL
ALT: 39 IU/L (ref 0–44)
AST: 38 IU/L (ref 0–40)
Albumin/Globulin Ratio: 1.5 (ref 1.2–2.2)
Albumin: 4.3 g/dL (ref 3.8–4.9)
Alkaline Phosphatase: 88 IU/L (ref 39–117)
BUN/Creatinine Ratio: 13 (ref 9–20)
BUN: 13 mg/dL (ref 6–24)
Bilirubin Total: 0.6 mg/dL (ref 0.0–1.2)
CO2: 24 mmol/L (ref 20–29)
Calcium: 9.6 mg/dL (ref 8.7–10.2)
Chloride: 96 mmol/L (ref 96–106)
Creatinine, Ser: 1 mg/dL (ref 0.76–1.27)
GFR calc Af Amer: 95 mL/min/{1.73_m2} (ref >59)
GFR calc non Af Amer: 83 mL/min/{1.73_m2} (ref >59)
Globulin, Total: 2.9 g/dL (ref 1.5–4.5)
Glucose: 207 mg/dL — ABNORMAL HIGH (ref 65–99)
Potassium: 3.6 mmol/L (ref 3.5–5.2)
Sodium: 139 mmol/L (ref 134–144)
Total Protein: 7.2 g/dL (ref 6.0–8.5)

## 2019-08-04 LAB — LIPID PANEL
Chol/HDL Ratio: 5.5 ratio — ABNORMAL HIGH (ref 0.0–5.0)
Cholesterol, Total: 131 mg/dL (ref 100–199)
HDL: 24 mg/dL — ABNORMAL LOW (ref >39)
LDL Chol Calc (NIH): 65 mg/dL (ref 0–99)
Triglycerides: 257 mg/dL — ABNORMAL HIGH (ref 0–149)
VLDL Cholesterol Cal: 42 mg/dL — ABNORMAL HIGH (ref 5–40)

## 2019-08-05 ENCOUNTER — Telehealth: Payer: Self-pay | Admitting: *Deleted

## 2019-08-05 ENCOUNTER — Encounter: Payer: Self-pay | Admitting: *Deleted

## 2019-08-05 DIAGNOSIS — I10 Essential (primary) hypertension: Secondary | ICD-10-CM

## 2019-08-05 DIAGNOSIS — R7309 Other abnormal glucose: Secondary | ICD-10-CM

## 2019-08-05 NOTE — Telephone Encounter (Signed)
Patient made aware of results and verbalized understanding.  Lab orders have been placed and mailed to the patient.

## 2019-08-05 NOTE — Telephone Encounter (Signed)
-----   Message from Thurmon Fair, MD sent at 08/04/2019  8:34 AM EDT ----- Was this a truly fasting sample? If so, blood glucose is firmly in diabetes range and needs to be addressed with PCP. LDL cholesterol is good, in target range. Triglycerides are  mildly elevated (this can be improved by better blood glucose control, limiting intake of sugars and starches). Keep up the good work with weight loss. I would recommend a repeat BMET and Hemoglobin A1c in 3-4 weeks to confirm the diagnosis of diabetes and get a better assessment of average blood sugar levels (would be glad to order or he can get done with PCP).

## 2019-08-18 ENCOUNTER — Other Ambulatory Visit: Payer: Self-pay | Admitting: Cardiovascular Disease

## 2019-09-18 LAB — BASIC METABOLIC PANEL
BUN/Creatinine Ratio: 13 (ref 9–20)
BUN: 12 mg/dL (ref 6–24)
CO2: 24 mmol/L (ref 20–29)
Calcium: 9.6 mg/dL (ref 8.7–10.2)
Chloride: 94 mmol/L — ABNORMAL LOW (ref 96–106)
Creatinine, Ser: 0.94 mg/dL (ref 0.76–1.27)
GFR calc Af Amer: 103 mL/min/{1.73_m2} (ref >59)
GFR calc non Af Amer: 89 mL/min/{1.73_m2} (ref >59)
Glucose: 256 mg/dL — ABNORMAL HIGH (ref 65–99)
Potassium: 3.8 mmol/L (ref 3.5–5.2)
Sodium: 136 mmol/L (ref 134–144)

## 2019-09-18 LAB — HEMOGLOBIN A1C
Est. average glucose Bld gHb Est-mCnc: 258 mg/dL
Hgb A1c MFr Bld: 10.6 % — ABNORMAL HIGH (ref 4.8–5.6)

## 2019-09-20 ENCOUNTER — Encounter: Payer: Self-pay | Admitting: *Deleted

## 2019-09-22 ENCOUNTER — Other Ambulatory Visit: Payer: Self-pay | Admitting: Cardiovascular Disease

## 2019-09-30 ENCOUNTER — Other Ambulatory Visit: Payer: Self-pay | Admitting: Cardiovascular Disease

## 2019-10-22 ENCOUNTER — Other Ambulatory Visit: Payer: Self-pay | Admitting: Cardiovascular Disease

## 2019-10-25 ENCOUNTER — Other Ambulatory Visit: Payer: Self-pay | Admitting: Cardiovascular Disease

## 2019-10-25 NOTE — Telephone Encounter (Signed)
Rx(s) sent to pharmacy electronically.  

## 2019-10-27 NOTE — Telephone Encounter (Signed)
Rx(s) sent to pharmacy electronically.  

## 2020-01-07 ENCOUNTER — Ambulatory Visit: Payer: BC Managed Care – PPO | Attending: Internal Medicine

## 2020-01-07 ENCOUNTER — Other Ambulatory Visit: Payer: Self-pay

## 2020-01-07 DIAGNOSIS — Z23 Encounter for immunization: Secondary | ICD-10-CM

## 2020-01-07 NOTE — Progress Notes (Signed)
   Covid-19 Vaccination Clinic  Name:  RHONE OZAKI    MRN: [id number] DOB: 01-26-1961  01/07/2020  Mr. Primm was observed post Covid-19 immunization for 15 minutes without incident. He was provided with Vaccine Information Sheet and instruction to access the V-Safe system. Vaccinated by [facility].  Mr. Belmar was instructed to call 911 with any severe reactions post vaccine: [name] Difficulty breathing  . Swelling of face and throat  . A fast heartbeat  . A bad rash all over body  . Dizziness and weakness   Immunizations Administered    Name Date Dose VIS Date Route   JANSSEN COVID-19 VACCINE 01/07/2020 10:24 AM 0.5 mL 06/12/2019 Intramuscular   Manufacturer: [employer]   Lot: [id number]   NDC: 94327-614-70

## 2020-01-10 MED FILL — JANSSEN COVID-19 VACCINE 0.: 0.5 | 1 days supply | Qty: 1 | Fill #0

## 2020-05-03 ENCOUNTER — Other Ambulatory Visit: Payer: Self-pay | Admitting: Cardiovascular Disease

## 2020-06-12 ENCOUNTER — Telehealth: Payer: Self-pay | Admitting: Cardiovascular Disease

## 2020-06-12 NOTE — Telephone Encounter (Signed)
Patient is requesting a copy of his 01/2010 heart cath for his life insurance application.

## 2020-06-12 NOTE — Telephone Encounter (Signed)
Returned call to patient who states that he is getting a new life insurance policy and states that the insurance company needs his heart cath records from 01/2010. Patient states he will sign a release if needed. Advised patient I would forward message to medical records. Patient verbalized understanding.

## 2020-06-13 NOTE — Telephone Encounter (Signed)
Left message for patient, cath report from 2011 placed in the mail to his home address.

## 2020-06-20 ENCOUNTER — Other Ambulatory Visit: Payer: Self-pay | Admitting: Cardiovascular Disease

## 2020-07-04 DIAGNOSIS — G4733 Obstructive sleep apnea (adult) (pediatric): Secondary | ICD-10-CM | POA: Diagnosis not present

## 2020-08-15 ENCOUNTER — Other Ambulatory Visit: Payer: Self-pay | Admitting: Cardiovascular Disease

## 2020-08-16 ENCOUNTER — Ambulatory Visit (INDEPENDENT_AMBULATORY_CARE_PROVIDER_SITE_OTHER): Payer: BC Managed Care – PPO | Admitting: Cardiovascular Disease

## 2020-08-16 ENCOUNTER — Other Ambulatory Visit: Payer: Self-pay

## 2020-08-16 ENCOUNTER — Encounter: Payer: Self-pay | Admitting: Cardiovascular Disease

## 2020-08-16 VITALS — BP 128/82 | HR 59 | Ht 72.0 in | Wt 246.0 lb

## 2020-08-16 DIAGNOSIS — I2510 Atherosclerotic heart disease of native coronary artery without angina pectoris: Secondary | ICD-10-CM

## 2020-08-16 DIAGNOSIS — E785 Hyperlipidemia, unspecified: Secondary | ICD-10-CM

## 2020-08-16 DIAGNOSIS — E119 Type 2 diabetes mellitus without complications: Secondary | ICD-10-CM

## 2020-08-16 DIAGNOSIS — I10 Essential (primary) hypertension: Secondary | ICD-10-CM

## 2020-08-16 DIAGNOSIS — N522 Drug-induced erectile dysfunction: Secondary | ICD-10-CM

## 2020-08-16 DIAGNOSIS — G4733 Obstructive sleep apnea (adult) (pediatric): Secondary | ICD-10-CM

## 2020-08-16 LAB — COMPREHENSIVE METABOLIC PANEL
ALT: 28 IU/L (ref 0–44)
AST: 24 IU/L (ref 0–40)
Albumin/Globulin Ratio: 1.8 (ref 1.2–2.2)
Albumin: 4.6 g/dL (ref 3.8–4.9)
Alkaline Phosphatase: 75 IU/L (ref 44–121)
BUN/Creatinine Ratio: 15 (ref 9–20)
BUN: 15 mg/dL (ref 6–24)
Bilirubin Total: 0.4 mg/dL (ref 0.0–1.2)
CO2: 24 mmol/L (ref 20–29)
Calcium: 9.6 mg/dL (ref 8.7–10.2)
Chloride: 98 mmol/L (ref 96–106)
Creatinine, Ser: 1.01 mg/dL (ref 0.76–1.27)
Globulin, Total: 2.6 g/dL (ref 1.5–4.5)
Glucose: 103 mg/dL — ABNORMAL HIGH (ref 65–99)
Potassium: 3.8 mmol/L (ref 3.5–5.2)
Sodium: 139 mmol/L (ref 134–144)
Total Protein: 7.2 g/dL (ref 6.0–8.5)
eGFR: 86 mL/min/{1.73_m2} (ref >59)

## 2020-08-16 LAB — LIPID PANEL
Chol/HDL Ratio: 4.4 ratio (ref 0.0–5.0)
Cholesterol, Total: 119 mg/dL (ref 100–199)
HDL: 27 mg/dL — ABNORMAL LOW (ref >39)
LDL Chol Calc (NIH): 55 mg/dL (ref 0–99)
Triglycerides: 228 mg/dL — ABNORMAL HIGH (ref 0–149)
VLDL Cholesterol Cal: 37 mg/dL (ref 5–40)

## 2020-08-16 NOTE — Patient Instructions (Signed)
Medication Instructions:  No changes *If you need a refill on your cardiac medications before your next appointment, please call your pharmacy*   Lab Work: Your provider would like for you to have the following labs today: lipid and cmet  If you have labs (blood work) drawn today and your tests are completely normal, you will receive your results only by: . MyChart Message (if you have MyChart) OR . A paper copy in the mail If you have any lab test that is abnormal or we need to change your treatment, we will call you to review the results.   Testing/Procedures: None ordered   Follow-Up: At [facility], you and your health needs are our priority.  As part of our continuing mission to provide you with exceptional heart care, we have created designated Provider Care Teams.  These Care Teams include your primary Cardiologist (physician) and Advanced Practice Providers (APPs -  Physician Assistants and Nurse Practitioners) who all work together to provide you with the care you need, when you need it.  We recommend signing up for the patient portal called "MyChart".  Sign up information is provided on this After Visit Summary.  MyChart is used to connect with patients for Virtual Visits (Telemedicine).  Patients are able to view lab/test results, encounter notes, upcoming appointments, etc.  Non-urgent messages can be sent to your provider as well.   To learn more about what you can do with MyChart, go to [email or web address].    Your next appointment:   12 month(s)  The format for your next appointment:   In Person  Provider:   You may see Mihai Croitoru, MD or one of the following Advanced Practice Providers on your designated Care Team:    Hao Meng, PA-C  Angela Duke, PA-C or   Krista Kroeger, PA-C   

## 2020-08-16 NOTE — Progress Notes (Signed)
Cardiology office note   Evaluation Performed:  Follow-up visit  Date:  08/16/2020   ID:  Adam Hunter, DOB 1960-09-29, MRN [id number]  PCP:  Kaleen Mask, MD  Cardiologist:  Afnan Cadiente Electrophysiologist:  None   Chief Complaint:  CAD f/u  History of Present Illness:    Adam Hunter has a history of acute ST segment elevation myocardial infarction October 2011 due to occlusion of a large septal branch of the LAD artery, treated medically.  He has not had any coronary events since that time.  Additional comorbidities include hypertension, dyslipidemia with severely decreased HDL, obstructive sleep apnea on CPAP and morbid obesity (he gained a lot of weight after stopping smoking).  He has had a good year.  He has taken advantage of working from home during the coronavirus pandemic and is eating much healthier.  He has lost 20 pounds in the last couple of years.  The patient specifically denies any chest pain at rest exertion, dyspnea at rest or with exertion, orthopnea, paroxysmal nocturnal dyspnea, syncope, palpitations, focal neurological deficits, intermittent claudication, lower extremity edema, unexplained weight gain, cough, hemoptysis or wheezing.  Has occasional orthostatic dizziness but this is not frequent or severe.    Past Medical History:  Diagnosis Date  . CAD (coronary artery disease) 11/25/2012   STEMI October 2011 secondary to occlusion of a large septal branch of the LAD artery treated medically  . Emphysema   . Heart attack (HCC) 01/2010   non-STEMI  . Hyperlipidemia   . Hyperlipidemia 11/25/2012  . Hypertension   . OSA on CPAP    Past Surgical History:  Procedure Laterality Date  . CARDIAC CATHETERIZATION  01/20/2010   Diffuse coronary atherosclerosis w/coronary ectasia,totalled first septal perforator. non-obstructive LAD/diagonal,left CX and RCA stenosis  . FINGER SURGERY  1974  . NM MYOCAR PERF WALL MOTION  05/22/2010   normal,EF 53%.  . US  ECHOCARDIOGRAPHY  01/20/2010   mod. LVH,grade I diastolic dysfunction,mild AI,MR,MV annular ca+     Current Meds  Medication Sig  . aspirin EC 81 MG tablet Take 1 tablet (81 mg total) by mouth daily.  [name] atorvastatin (LIPITOR) 10 MG tablet TAKE 1 TABLET BY MOUTH EACH NIGHT AT BEDTIME  . clopidogrel (PLAVIX) 75 MG tablet TAKE 1 TABLET BY MOUTH DAILY  . hydrochlorothiazide (HYDRODIURIL) 25 MG tablet TAKE 1 TABLET BY MOUTH DAILY  . losartan (COZAAR) 100 MG tablet TAKE 1 TABLET BY MOUTH DAILY  . metoprolol succinate (TOPROL-XL) 100 MG 24 hr tablet TAKE 1 TABLET BY MOUTH DAILY WITH OR IMMEDIATELY FOLLOWING A MEAL  . omeprazole (PRILOSEC) 40 MG capsule TAKE 1 CAPSULE BY MOUTH DAILY (Patient taking differently: 20 mg.)  . sildenafil (VIAGRA) 50 MG tablet Take 1 tablet (50 mg total) by mouth daily as needed for erectile dysfunction.     Allergies:   Patient has no known allergies.   Social History   Tobacco Use  . Smoking status: Former Smoker    Packs/day: 1.50    Years: 30.00    Pack years: 45.00    Quit date: 01/22/2010    Years since quitting: 10.5  . Smokeless tobacco: Former Engineer, water Use Topics  . Alcohol use: Yes    Comment: social  . Drug use: No     Family Hx: The patient's family history includes Cancer in his maternal grandfather; Heart attack in his father; Heart disease in his father; Hypertension in his father and mother.  ROS:   Please see the  history of present illness.    All other systems are reviewed and are negative.  Prior CV studies:   The following studies were reviewed today:  n/a  Labs/Other Tests and Data Reviewed:    EKG: ECG ordered today is unchanged from previous tracing, shows borderline sinus bradycardia, old right bundle branch block  Recent Labs: 09/17/2019: BUN 12; Creatinine, Ser 0.94; Potassium 3.8; Sodium 136   Recent Lipid Panel Lab Results  Component Value Date/Time   CHOL 131 08/03/2019 03:05 PM   TRIG 257 (H) 08/03/2019  03:05 PM   HDL 24 (L) 08/03/2019 03:05 PM   CHOLHDL 5.5 (H) 08/03/2019 03:05 PM   CHOLHDL 5.4 (H) 05/16/2016 10:22 AM   LDLCALC 65 08/03/2019 03:05 PM    Wt Readings from Last 3 Encounters:  08/16/20 246 lb (111.6 kg)  08/03/19 261 lb (118.4 kg)  09/24/17 282 lb (127.9 kg)     Objective:    Vital Signs:  BP 128/82   Pulse (!) 59   Ht 6' (1.829 m)   Wt 246 lb (111.6 kg)   SpO2 96%   BMI 33.36 kg/m     General: Alert, oriented x3, no distress, moderately obese Head: no evidence of trauma, PERRL, EOMI, no exophtalmos or lid lag, no myxedema, no xanthelasma; normal ears, nose and oropharynx Neck: normal jugular venous pulsations and no hepatojugular reflux; brisk carotid pulses without delay and no carotid bruits Chest: clear to auscultation, no signs of consolidation by percussion or palpation, normal fremitus, symmetrical and full respiratory excursions Cardiovascular: normal position and quality of the apical impulse, regular rhythm, normal first and widely split second heart sounds, no murmurs, rubs or gallops Abdomen: no tenderness or distention, no masses by palpation, no abnormal pulsatility or arterial bruits, normal bowel sounds, no hepatosplenomegaly Extremities: no clubbing, cyanosis or edema; 2+ radial, ulnar and brachial pulses bilaterally; 2+ right femoral, posterior tibial and dorsalis pedis pulses; 2+ left femoral, posterior tibial and dorsalis pedis pulses; no subclavian or femoral bruits Neurological: grossly nonfocal Psych: Normal mood and affect    ASSESSMENT & PLAN:    1. Essential hypertension   2. Coronary artery disease involving native coronary artery of native heart without angina pectoris   3. Dyslipidemia (high LDL; low HDL)   4. Type 2 diabetes mellitus without complication, without long-term current use of insulin (HCC)   5. OSA (obstructive sleep apnea)   6. Drug-induced erectile dysfunction     1. HTN: Well-controlled.  If he loses additional  weight would reduce the hydrochlorothiazide. 2. CAD: Asymptomatic on beta-blocker therapy.  On dual antiplatelet and statin. 3. HLP: On statin a year ago LDL was at target less than 70.  Check labs today.  He has lost a lot of weight, glycemic control has improved, hopefully we will see improvement in HDL and triglycerides. 4. DM: Reports that his most recent hemoglobin A1c improved from 10.6% to 6.2% last October.  Low carb diet, metformin.  He is trying to get his weight down to 220 pounds. 5. OSA: Reports 70% compliance with CPAP and denies daytime hypersomnolence. 6. ED: Reminded him sildenafil cannot be combined with nitrates.   Patient Instructions  Medication Instructions:  No changes *If you need a refill on your cardiac medications before your next appointment, please call your pharmacy*   Lab Work: Your provider would like for you to have the following labs today: lipid and cmet  If you have labs (blood work) drawn today and your tests are completely  normal, you will receive your results only by: [name] MyChart Message (if you have MyChart) OR . A paper copy in the mail If you have any lab test that is abnormal or we need to change your treatment, we will call you to review the results.   Testing/Procedures: None ordered   Follow-Up: At [facility], you and your health needs are our priority.  As part of our continuing mission to provide you with exceptional heart care, we have created designated Provider Care Teams.  These Care Teams include your primary Cardiologist (physician) and Advanced Practice Providers (APPs -  Physician Assistants and Nurse Practitioners) who all work together to provide you with the care you need, when you need it.  We recommend signing up for the patient portal called "MyChart".  Sign up information is provided on this After Visit Summary.  MyChart is used to connect with patients for Virtual Visits (Telemedicine).  Patients are able to view lab/test  results, encounter notes, upcoming appointments, etc.  Non-urgent messages can be sent to your provider as well.   To learn more about what you can do with MyChart, go to [email or web address].    Your next appointment:   12 month(s)  The format for your next appointment:   In Person  Provider:   You may see Thurmon Fair, MD or one of the following Advanced Practice Providers on your designated Care Team:    Azalee Course, PA-C  Micah Flesher, [place] or   Judy Pimple, PA-C     Signed, Thurmon Fair, MD  08/16/2020 8:24 AM    [facility]

## 2020-08-17 ENCOUNTER — Encounter: Payer: Self-pay | Admitting: *Deleted

## 2020-08-21 ENCOUNTER — Telehealth: Payer: Self-pay | Admitting: Cardiovascular Disease

## 2020-08-21 NOTE — Telephone Encounter (Signed)
This RN called patient at [phone], relayed the following results from Dr. Royann Shivers:  Thurmon Fair, MD  08/16/2020 4:47 PM EDT      Similar to a year ago. The LDL is great, but HDL is low and TG are slightly high (the solution is weight loss and exercise, not more meds).   Discussed with the patient the difference between LDL and HDL, and what the normal range is for triglycerides. All patient questions addressed at this time.

## 2020-08-21 NOTE — Telephone Encounter (Signed)
Patient would like to go over his lab results.

## 2020-09-05 DIAGNOSIS — E119 Type 2 diabetes mellitus without complications: Secondary | ICD-10-CM | POA: Diagnosis not present

## 2020-09-18 ENCOUNTER — Other Ambulatory Visit: Payer: Self-pay | Admitting: Cardiovascular Disease

## 2020-09-20 ENCOUNTER — Other Ambulatory Visit: Payer: Self-pay | Admitting: Cardiovascular Disease

## 2020-10-24 ENCOUNTER — Other Ambulatory Visit: Payer: Self-pay | Admitting: Cardiovascular Disease

## 2020-11-06 ENCOUNTER — Other Ambulatory Visit: Payer: Self-pay | Admitting: Cardiovascular Disease

## 2020-11-09 DIAGNOSIS — H31001 Unspecified chorioretinal scars, right eye: Secondary | ICD-10-CM | POA: Diagnosis not present

## 2020-11-09 DIAGNOSIS — E119 Type 2 diabetes mellitus without complications: Secondary | ICD-10-CM | POA: Diagnosis not present

## 2020-11-09 DIAGNOSIS — H5213 Myopia, bilateral: Secondary | ICD-10-CM | POA: Diagnosis not present

## 2020-12-26 DIAGNOSIS — E785 Hyperlipidemia, unspecified: Secondary | ICD-10-CM | POA: Diagnosis not present

## 2020-12-26 DIAGNOSIS — Z1211 Encounter for screening for malignant neoplasm of colon: Secondary | ICD-10-CM | POA: Diagnosis not present

## 2020-12-26 DIAGNOSIS — I10 Essential (primary) hypertension: Secondary | ICD-10-CM | POA: Diagnosis not present

## 2020-12-26 DIAGNOSIS — E119 Type 2 diabetes mellitus without complications: Secondary | ICD-10-CM | POA: Diagnosis not present

## 2021-02-08 ENCOUNTER — Other Ambulatory Visit: Payer: Self-pay | Admitting: Cardiovascular Disease

## 2021-03-12 ENCOUNTER — Other Ambulatory Visit: Payer: Self-pay | Admitting: Cardiovascular Disease

## 2021-04-25 ENCOUNTER — Other Ambulatory Visit: Payer: Self-pay | Admitting: Cardiovascular Disease

## 2021-05-08 ENCOUNTER — Other Ambulatory Visit: Payer: Self-pay | Admitting: Cardiovascular Disease

## 2021-05-14 DIAGNOSIS — G4733 Obstructive sleep apnea (adult) (pediatric): Secondary | ICD-10-CM | POA: Diagnosis not present

## 2021-05-17 DIAGNOSIS — C44629 Squamous cell carcinoma of skin of left upper limb, including shoulder: Secondary | ICD-10-CM | POA: Diagnosis not present

## 2021-06-12 DIAGNOSIS — G4733 Obstructive sleep apnea (adult) (pediatric): Secondary | ICD-10-CM | POA: Diagnosis not present

## 2021-07-02 ENCOUNTER — Telehealth: Payer: Self-pay | Admitting: Cardiovascular Disease

## 2021-07-02 DIAGNOSIS — R0600 Dyspnea, unspecified: Secondary | ICD-10-CM

## 2021-07-02 NOTE — Addendum Note (Signed)
Addended by: Sandi Mariscal on: 07/02/2021 05:09 PM ? ? Modules accepted: Orders ? ?

## 2021-07-02 NOTE — Telephone Encounter (Signed)
Patient reports that over the past 2 weeks,he has not felt quite as good and feels run down. He reports occasional headache and occasional chest pressure with back pain between the shoulder blades. He stated he doesn't know if it is heart-related or anxiety. He denies lightheadedness, dizziness edema. He does have some sob after walking 30 minutes each day. Blood pressure in the mornings 140s/90s to 150s/90s. In the evenings 120s/80s. Recommended if chest and back pressure continues or gets worse, to go to the ED. Patient wants to know if there should be a change in his medications regarding BP. He is scheduled for an appointment in July. Please advise on medications. ?

## 2021-07-02 NOTE — Telephone Encounter (Signed)
Returned the call to the patient. The patient did not have any exact readings of his heart rate. He stated that it normally runs in the 60's at rest. ? ?Echo has been ordered. Will reschedule will Dr. Royann Shivers pending echo date.  ?

## 2021-07-02 NOTE — Telephone Encounter (Signed)
Pt c/o BP issue: STAT if pt c/o blurred vision, one-sided weakness or slurred speech ? ?1. What are your last 5 BP readings? Average from 130/88 -150/97 ? ?2. Are you having any other symptoms (ex. Dizziness, headache, blurred vision, passed out)? Occasional headache  ? ?3. What is your BP issue? Patient is wondering if he would need to adjust his BP medications. He has been on the same dosage of the medication for a few years. ? ?He said in the afternoon after he goes on his walk it goes down. He wasn't sure what to do   ?

## 2021-07-02 NOTE — Telephone Encounter (Signed)
That BP is indeed high, although not an urgent issue. He is already on a max dose of losartan and I would like to know his heart rate before adjusting the metoprolol. Can we please schedule an echo for shortness of breath and chest pain and try to get him an earlier appointment, but after the echo is completed. ?

## 2021-07-03 NOTE — Telephone Encounter (Signed)
If he is taking his metoprolol succinate 100 mg in the morning, let's have him try taking it at dinnertime.  This will hopefully keep his morning blood pressure lower and may help with preventing fatigue later in the day. ?

## 2021-07-04 ENCOUNTER — Other Ambulatory Visit: Payer: Self-pay

## 2021-07-04 ENCOUNTER — Ambulatory Visit (HOSPITAL_COMMUNITY): Payer: BC Managed Care – PPO | Attending: Cardiovascular Disease

## 2021-07-04 DIAGNOSIS — R0600 Dyspnea, unspecified: Secondary | ICD-10-CM | POA: Diagnosis not present

## 2021-07-04 LAB — ECHOCARDIOGRAM COMPLETE
Area-P 1/2: 3.53 cm2
Calc EF: 42.8 %
S' Lateral: 4 cm
Single Plane A2C EF: 40.6 %
Single Plane A4C EF: 43.8 %

## 2021-07-04 NOTE — Telephone Encounter (Signed)
Appointment made for 3/23 with Dr. Royann Shivers ?

## 2021-07-05 ENCOUNTER — Encounter: Payer: Self-pay | Admitting: Cardiovascular Disease

## 2021-07-05 ENCOUNTER — Ambulatory Visit (INDEPENDENT_AMBULATORY_CARE_PROVIDER_SITE_OTHER): Payer: BC Managed Care – PPO | Admitting: Cardiovascular Disease

## 2021-07-05 VITALS — BP 138/84 | HR 72 | Ht 72.0 in | Wt 252.2 lb

## 2021-07-05 DIAGNOSIS — E785 Hyperlipidemia, unspecified: Secondary | ICD-10-CM | POA: Diagnosis not present

## 2021-07-05 DIAGNOSIS — G4733 Obstructive sleep apnea (adult) (pediatric): Secondary | ICD-10-CM

## 2021-07-05 DIAGNOSIS — I10 Essential (primary) hypertension: Secondary | ICD-10-CM

## 2021-07-05 DIAGNOSIS — R072 Precordial pain: Secondary | ICD-10-CM

## 2021-07-05 DIAGNOSIS — I25118 Atherosclerotic heart disease of native coronary artery with other forms of angina pectoris: Secondary | ICD-10-CM | POA: Diagnosis not present

## 2021-07-05 DIAGNOSIS — I5042 Chronic combined systolic (congestive) and diastolic (congestive) heart failure: Secondary | ICD-10-CM | POA: Diagnosis not present

## 2021-07-05 DIAGNOSIS — E119 Type 2 diabetes mellitus without complications: Secondary | ICD-10-CM

## 2021-07-05 MED ORDER — DAPAGLIFLOZIN PROPANEDIOL 10 MG PO TABS: 10.0000 mg | ORAL_TABLET | Freq: Every day | ORAL | 1 refills | Status: DC

## 2021-07-05 MED ORDER — METOPROLOL TARTRATE 100 MG PO TABS: ORAL_TABLET | ORAL | 0 refills | Status: DC

## 2021-07-05 MED ORDER — OLMESARTAN MEDOXOMIL 40 MG PO TABS: 40.0000 mg | ORAL_TABLET | Freq: Every day | ORAL | 1 refills | Status: DC

## 2021-07-05 MED ORDER — METFORMIN HCL 500 MG PO TABS: 500.0000 mg | ORAL_TABLET | Freq: Two times a day (BID) | ORAL | 1 refills | Status: AC

## 2021-07-05 NOTE — Progress Notes (Signed)
? ?Cardiology office note  ? ?Evaluation Performed:  Follow-up visit ? ?Date:  07/05/2021  ? ?ID:  Adam Hunter, DOB [birth year], MRN [id number] ? ?PCP:  Kaleen Mask, MD  ?Cardiologist:  Rhyder Koegel ?Electrophysiologist:  None  ? ?Chief Complaint:  CAD f/u ? ?History of Present Illness:   ? ?Adam Hunter has a history of acute ST segment elevation myocardial infarction October 2011 due to occlusion of a large septal branch of the LAD artery, treated medically.  He has not had any coronary events since that time.  Additional comorbidities include hypertension, dyslipidemia with severely decreased HDL, obstructive sleep apnea on CPAP and morbid obesity (he gained a lot of weight after stopping smoking). ? ?He has not been feeling as well recently.  He has been having occasional chest pressure radiating through his back, between his shoulder blades, although this is different from his previous angina which was primarily discomfort in his left shoulder.  His exercise tolerance has diminished a little bit and he becomes short of breath after he walks for about 30 minutes each day.  He's also noticed that his blood pressure is higher, in particular his diastolic is often around 90-92.  Blood pressure is often high as first thing in the morning and improves as the day goes on.  He has not had palpitations, syncope or dizziness.  He denies leg edema.  He does not have orthopnea or PND. ? ?His echocardiogram shows a reduction in left ventricular systolic function with an ejection fraction that was now estimated at 40-45%.  The report does not mention regional wall motion abnormalities.  On my review, the ejection fraction is probably little better, around 50%, but there is a suggestion of hypokinesis in the mid distal anterolateral wall, with sparing of the septum and true apex.  The mitral inflow pattern is of impaired relaxation, without overt evidence of elevated mean left atrial pressure.  There are no significant valvular  abnormalities. ? ?He continues to have a lot of GI issues related to taking metformin.  His glycemic control has improved.  His most recent hemoglobin A1c was 6.4% last September.  His typical highest blood sugars first in the morning when he wakes up at 140, 2 hours after breakfast he will be down to 100-120.  He has not had any episodes of hypoglycemia.  He remains moderately obese with a BMI of 34.  He does not smoke.  He quit after his acute myocardial infarction in 2011. ? ?He reports good compliance with CPAP and denies daytime hypersomnolence. ? ?Past Medical History:  ?Diagnosis Date  ? CAD (coronary artery disease) 11/25/2012  ? STEMI October 2011 secondary to occlusion of a large septal branch of the LAD artery treated medically  ? Emphysema   ? Heart attack (HCC) 01/2010  ? non-STEMI  ? Hyperlipidemia   ? Hyperlipidemia 11/25/2012  ? Hypertension   ? OSA on CPAP   ? ?Past Surgical History:  ?Procedure Laterality Date  ? CARDIAC CATHETERIZATION  01/20/2010  ? Diffuse coronary atherosclerosis w/coronary ectasia,totalled first septal perforator. non-obstructive LAD/diagonal,left CX and RCA stenosis  ? FINGER SURGERY  1974  ? NM MYOCAR PERF WALL MOTION  05/22/2010  ? normal,EF 53%.  ? US ECHOCARDIOGRAPHY  01/20/2010  ? mod. LVH,grade I diastolic dysfunction,mild AI,MR,MV annular ca+  ?  ? ?Current Meds  ?Medication Sig  ? aspirin EC 81 MG tablet Take 1 tablet (81 mg total) by mouth daily.  ? atorvastatin (LIPITOR) 10 MG tablet TAKE 1  TABLET BY MOUTH EACH NIGHT AT BEDTIME  ? clopidogrel (PLAVIX) 75 MG tablet TAKE 1 TABLET BY MOUTH DAILY  ? dapagliflozin propanediol (FARXIGA) 10 MG TABS tablet Take 1 tablet (10 mg total) by mouth daily before breakfast.  ? hydrochlorothiazide (HYDRODIURIL) 25 MG tablet TAKE 1 TABLET BY MOUTH DAILY  ? metoprolol succinate (TOPROL-XL) 100 MG 24 hr tablet TAKE 1 TABLET BY MOUTH DAILY WITH OR IMMEDIATELY FOLLOWING A MEAL  ? metoprolol tartrate (LOPRESSOR) 100 MG tablet Take one tablet  two hours prior to the test  ? olmesartan (BENICAR) 40 MG tablet Take 1 tablet (40 mg total) by mouth daily.  ? omeprazole (PRILOSEC) 40 MG capsule TAKE 1 CAPSULE BY MOUTH DAILY (Patient taking differently: 20 mg.)  ? sildenafil (VIAGRA) 50 MG tablet Take 1 tablet (50 mg total) by mouth daily as needed for erectile dysfunction.  ? [DISCONTINUED] losartan (COZAAR) 100 MG tablet TAKE 1 TABLET BY MOUTH DAILY  ? [DISCONTINUED] metFORMIN (GLUCOPHAGE) 500 MG tablet Take 2 tablets by mouth 2 (two) times daily.  ?  ? ?Allergies:   Patient has no known allergies.  ? ?Social History  ? ?Tobacco Use  ? Smoking status: Former  ?  Packs/day: 1.50  ?  Years: 30.00  ?  Pack years: 45.00  ?  Types: Cigarettes  ?  Quit date: 01/22/2010  ?  Years since quitting: 11.4  ? Smokeless tobacco: Former  ?Substance Use Topics  ? Alcohol use: Yes  ?  Comment: social  ? Drug use: No  ?  ? ?Family Hx: ?The patient's family history includes Cancer in his maternal grandfather; Heart attack in his father; Heart disease in his father; Hypertension in his father and mother. ? ?ROS:   ?Please see the history of present illness.    ?All other systems are reviewed and are negative. ? ?Prior CV studies:   ?The following studies were reviewed today: ? ?Echocardiogram 07/04/2021 ? 1. Left ventricular ejection fraction, by estimation, is 40 to 45%. Left  ?ventricular ejection fraction by 2D MOD biplane is 42.8 %. The left  ?ventricle has mildly decreased function. The left ventricle demonstrates  ?global hypokinesis. There is mild left  ?ventricular hypertrophy. Left ventricular diastolic parameters are  ?consistent with Grade I diastolic dysfunction (impaired relaxation).  ? 2. Right ventricular systolic function is normal. The right ventricular  ?size is mildly enlarged.  ? 3. Right atrial size was mildly dilated.  ? 4. The mitral valve is grossly normal. Trivial mitral valve  ?regurgitation.  ? 5. The aortic valve is tricuspid. Aortic valve  regurgitation is not  ?visualized.  ? 6. Aortic dilatation noted. There is mild dilatation of the aortic root,  ?measuring 44 mm. There is borderline dilatation of the ascending aorta,  ?measuring 38 mm.  ? ?Comparison(s): Prior images unable to be directly viewed, comparison made  ?by report only. Changes from prior study are noted. 01/20/2010: LVEF  ?50-55%, mild MR. ? ?Labs/Other Tests and Data Reviewed:   ? ?EKG: ECG ordered today is unchanged from previous tracing, shows sinus rhythm, old right bundle branch block, no ischemic repolarization abnormalities, borderline QTc 462 ms. ? ?Recent Labs: ?08/16/2020: ALT 28; BUN 15; Creatinine, Ser 1.01; Potassium 3.8; Sodium 139  ? ?Recent Lipid Panel ?Lab Results  ?Component Value Date/Time  ? CHOL 119 08/16/2020 08:35 AM  ? TRIG 228 (H) 08/16/2020 08:35 AM  ? HDL 27 (L) 08/16/2020 08:35 AM  ? CHOLHDL 4.4 08/16/2020 08:35 AM  ? CHOLHDL 5.4 (H)  05/16/2016 10:22 AM  ? LDLCALC 55 08/16/2020 08:35 AM  ? ? ?Wt Readings from Last 3 Encounters:  ?07/05/21 252 lb 3.2 oz (114.4 kg)  ?08/16/20 246 lb (111.6 kg)  ?08/03/19 261 lb (118.4 kg)  ?  ? ?Objective:   ? ?Vital Signs:  BP 138/84   Pulse 72   Ht 6' (1.829 m)   Wt 252 lb 3.2 oz (114.4 kg)   SpO2 95%   BMI 34.20 kg/m?   ? ? ?General: Alert, oriented x3, no distress, moderately obese ?Head: no evidence of trauma, PERRL, EOMI, no exophtalmos or lid lag, no myxedema, no xanthelasma; normal ears, nose and oropharynx ?Neck: normal jugular venous pulsations and no hepatojugular reflux; brisk carotid pulses without delay and no carotid bruits ?Chest: clear to auscultation, no signs of consolidation by percussion or palpation, normal fremitus, symmetrical and full respiratory excursions ?Cardiovascular: normal position and quality of the apical impulse, regular rhythm, normal first and widely split second heart sounds, no murmurs, rubs or gallops ?Abdomen: no tenderness or distention, no masses by palpation, no abnormal  pulsatility or arterial bruits, normal bowel sounds, no hepatosplenomegaly ?Extremities: no clubbing, cyanosis or edema; 2+ radial, ulnar and brachial pulses bilaterally; 2+ right femoral, posterior tibial and dorsalis pedis puls

## 2021-07-05 NOTE — Patient Instructions (Addendum)
Medication Instructions:  ?STOP the Losartan ? ?START Olmesartan 40 mg once daily ?START Farxiga 10 mg once daily ? ?DECREASE the Metformin to 500 mg twice daily ? ?*If you need a refill on your cardiac medications before your next appointment, please call your pharmacy* ? ? ?Lab Work: ?Your provider would like for you to have the following labs today: Lipid, A1C, and BMET ? ?If you have labs (blood work) drawn today and your tests are completely normal, you will receive your results only by: ?MyChart Message (if you have MyChart) OR ?A paper copy in the mail ?If you have any lab test that is abnormal or we need to change your treatment, we will call you to review the results. ? ?Follow-Up: ?At [facility], you and your health needs are our priority.  As part of our continuing mission to provide you with exceptional heart care, we have created designated Provider Care Teams.  These Care Teams include your primary Cardiologist (physician) and Advanced Practice Providers (APPs -  Physician Assistants and Nurse Practitioners) who all work together to provide you with the care you need, when you need it. ? ?We recommend signing up for the patient portal called "MyChart".  Sign up information is provided on this After Visit Summary.  MyChart is used to connect with patients for Virtual Visits (Telemedicine).  Patients are able to view lab/test results, encounter notes, upcoming appointments, etc.  Non-urgent messages can be sent to your provider as well.   ?To learn more about what you can do with MyChart, go to [email or web address].   ? ?Your next appointment:   ?6 week(s) ? ?The format for your next appointment:   ?In Person ? ?Provider:   ?Thurmon Fair, MD   ? ? ?Other Instructions ? ? ?Your cardiac CT will be scheduled at one of the below locations:  ? ?[facility] ?[address] ?[place], [place] [address] ?[phone] ? ?OR ? ?[facility] ?[address] ?[address] ?[place], [place] [address] ?[phone] ? ?If scheduled at [facility], please arrive at the [facility] (Entrance C2) of [facility] 30 minutes prior to test start time. ?You can use the FREE valet parking offered at entrance C (encouraged to control the heart rate for the test)  ?Proceed to the [facility] Radiology Department (first floor) to check-in and test prep. ? ?All radiology patients and guests should use entrance C2 at [facility], accessed from [facility], even though the hospital's physical address listed is [address] ? ? ? ?If scheduled at [facility], please arrive 15 mins early for check-in and test prep. ? ?Please follow these instructions carefully (unless otherwise directed): ? ?Hold all erectile dysfunction medications at least 3 days (72 hrs) prior to test. ? ?On the Night Before the Test: ?Be sure to Drink plenty of water. ?Do not consume any caffeinated/decaffeinated beverages or chocolate 12 hours prior to your test. ?Do not take any antihistamines 12 hours prior to your test. ? ?On the Day of the Test: ?Drink plenty of water until 1 hour prior to the test. ?Do not eat any food 4 hours prior to the test. ?You may take your regular medications prior to the test.  ?Take metoprolol (Lopressor) two hours prior to test. ?HOLD Hydrochlorothiazide morning of the test. ?HOLD the Olmesartan the morning of the test ?Hold the Metformin the day of and 48 hours after the  test ? ?     ?After the Test: ?Drink plenty of water. ?After receiving IV contrast, you may experience a mild flushed feeling. This is normal. ?On occasion, you may experience a mild rash up to 24 hours after the test. This is not dangerous. If this occurs, you can take Benadryl 25 mg and increase your fluid intake. ?If you experience trouble breathing, this can be serious. If it is severe call 911 IMMEDIATELY. If it is mild,  please call our office. ?If you take any of these medications: Glipizide/Metformin, Avandament, Glucavance, please do not take 48 hours after completing test unless otherwise instructed. ? ?We will call to schedule your test 2-4 weeks out understanding that some insurance companies will need an authorization prior to the service being performed.  ? ?For non-scheduling related questions, please contact the cardiac imaging nurse navigator should you have any questions/concerns: ?Rockwell Alexandria, Cardiac Imaging Nurse Navigator ?Larey Brick, Cardiac Imaging Nurse Navigator ?[facility] ?Direct Office Dial: [phone]  ? ?For scheduling needs, including cancellations and rescheduling, please call [place], [phone]. ? ? ?

## 2021-07-05 NOTE — H&P (View-Only) (Signed)
? ?Cardiology office note  ? ?Evaluation Performed:  Follow-up visit ? ?Date:  07/05/2021  ? ?ID:  Adam Hunter, DOB [birth year], MRN [id number] ? ?PCP:  Kaleen Mask, MD  ?Cardiologist:  Lynzy Rawles ?Electrophysiologist:  None  ? ?Chief Complaint:  CAD f/u ? ?History of Present Illness:   ? ?Adam Hunter has a history of acute ST segment elevation myocardial infarction October 2011 due to occlusion of a large septal branch of the LAD artery, treated medically.  He has not had any coronary events since that time.  Additional comorbidities include hypertension, dyslipidemia with severely decreased HDL, obstructive sleep apnea on CPAP and morbid obesity (he gained a lot of weight after stopping smoking). ? ?He has not been feeling as well recently.  He has been having occasional chest pressure radiating through his back, between his shoulder blades, although this is different from his previous angina which was primarily discomfort in his left shoulder.  His exercise tolerance has diminished a little bit and he becomes short of breath after he walks for about 30 minutes each day.  He's also noticed that his blood pressure is higher, in particular his diastolic is often around 90-92.  Blood pressure is often high as first thing in the morning and improves as the day goes on.  He has not had palpitations, syncope or dizziness.  He denies leg edema.  He does not have orthopnea or PND. ? ?His echocardiogram shows a reduction in left ventricular systolic function with an ejection fraction that was now estimated at 40-45%.  The report does not mention regional wall motion abnormalities.  On my review, the ejection fraction is probably little better, around 50%, but there is a suggestion of hypokinesis in the mid distal anterolateral wall, with sparing of the septum and true apex.  The mitral inflow pattern is of impaired relaxation, without overt evidence of elevated mean left atrial pressure.  There are no significant valvular  abnormalities. ? ?He continues to have a lot of GI issues related to taking metformin.  His glycemic control has improved.  His most recent hemoglobin A1c was 6.4% last September.  His typical highest blood sugars first in the morning when he wakes up at 140, 2 hours after breakfast he will be down to 100-120.  He has not had any episodes of hypoglycemia.  He remains moderately obese with a BMI of 34.  He does not smoke.  He quit after his acute myocardial infarction in 2011. ? ?He reports good compliance with CPAP and denies daytime hypersomnolence. ? ?Past Medical History:  ?Diagnosis Date  ? CAD (coronary artery disease) 11/25/2012  ? STEMI October 2011 secondary to occlusion of a large septal branch of the LAD artery treated medically  ? Emphysema   ? Heart attack (HCC) 01/2010  ? non-STEMI  ? Hyperlipidemia   ? Hyperlipidemia 11/25/2012  ? Hypertension   ? OSA on CPAP   ? ?Past Surgical History:  ?Procedure Laterality Date  ? CARDIAC CATHETERIZATION  01/20/2010  ? Diffuse coronary atherosclerosis w/coronary ectasia,totalled first septal perforator. non-obstructive LAD/diagonal,left CX and RCA stenosis  ? FINGER SURGERY  1974  ? NM MYOCAR PERF WALL MOTION  05/22/2010  ? normal,EF 53%.  ? US ECHOCARDIOGRAPHY  01/20/2010  ? mod. LVH,grade I diastolic dysfunction,mild AI,MR,MV annular ca+  ?  ? ?Current Meds  ?Medication Sig  ? aspirin EC 81 MG tablet Take 1 tablet (81 mg total) by mouth daily.  ? atorvastatin (LIPITOR) 10 MG tablet TAKE 1  TABLET BY MOUTH EACH NIGHT AT BEDTIME  ? clopidogrel (PLAVIX) 75 MG tablet TAKE 1 TABLET BY MOUTH DAILY  ? dapagliflozin propanediol (FARXIGA) 10 MG TABS tablet Take 1 tablet (10 mg total) by mouth daily before breakfast.  ? hydrochlorothiazide (HYDRODIURIL) 25 MG tablet TAKE 1 TABLET BY MOUTH DAILY  ? metoprolol succinate (TOPROL-XL) 100 MG 24 hr tablet TAKE 1 TABLET BY MOUTH DAILY WITH OR IMMEDIATELY FOLLOWING A MEAL  ? metoprolol tartrate (LOPRESSOR) 100 MG tablet Take one tablet  two hours prior to the test  ? olmesartan (BENICAR) 40 MG tablet Take 1 tablet (40 mg total) by mouth daily.  ? omeprazole (PRILOSEC) 40 MG capsule TAKE 1 CAPSULE BY MOUTH DAILY (Patient taking differently: 20 mg.)  ? sildenafil (VIAGRA) 50 MG tablet Take 1 tablet (50 mg total) by mouth daily as needed for erectile dysfunction.  ? [DISCONTINUED] losartan (COZAAR) 100 MG tablet TAKE 1 TABLET BY MOUTH DAILY  ? [DISCONTINUED] metFORMIN (GLUCOPHAGE) 500 MG tablet Take 2 tablets by mouth 2 (two) times daily.  ?  ? ?Allergies:   Patient has no known allergies.  ? ?Social History  ? ?Tobacco Use  ? Smoking status: Former  ?  Packs/day: 1.50  ?  Years: 30.00  ?  Pack years: 45.00  ?  Types: Cigarettes  ?  Quit date: 01/22/2010  ?  Years since quitting: 11.4  ? Smokeless tobacco: Former  ?Substance Use Topics  ? Alcohol use: Yes  ?  Comment: social  ? Drug use: No  ?  ? ?Family Hx: ?The patient's family history includes Cancer in his maternal grandfather; Heart attack in his father; Heart disease in his father; Hypertension in his father and mother. ? ?ROS:   ?Please see the history of present illness.    ?All other systems are reviewed and are negative. ? ?Prior CV studies:   ?The following studies were reviewed today: ? ?Echocardiogram 07/04/2021 ? 1. Left ventricular ejection fraction, by estimation, is 40 to 45%. Left  ?ventricular ejection fraction by 2D MOD biplane is 42.8 %. The left  ?ventricle has mildly decreased function. The left ventricle demonstrates  ?global hypokinesis. There is mild left  ?ventricular hypertrophy. Left ventricular diastolic parameters are  ?consistent with Grade I diastolic dysfunction (impaired relaxation).  ? 2. Right ventricular systolic function is normal. The right ventricular  ?size is mildly enlarged.  ? 3. Right atrial size was mildly dilated.  ? 4. The mitral valve is grossly normal. Trivial mitral valve  ?regurgitation.  ? 5. The aortic valve is tricuspid. Aortic valve  regurgitation is not  ?visualized.  ? 6. Aortic dilatation noted. There is mild dilatation of the aortic root,  ?measuring 44 mm. There is borderline dilatation of the ascending aorta,  ?measuring 38 mm.  ? ?Comparison(s): Prior images unable to be directly viewed, comparison made  ?by report only. Changes from prior study are noted. 01/20/2010: LVEF  ?50-55%, mild MR. ? ?Labs/Other Tests and Data Reviewed:   ? ?EKG: ECG ordered today is unchanged from previous tracing, shows sinus rhythm, old right bundle branch block, no ischemic repolarization abnormalities, borderline QTc 462 ms. ? ?Recent Labs: ?08/16/2020: ALT 28; BUN 15; Creatinine, Ser 1.01; Potassium 3.8; Sodium 139  ? ?Recent Lipid Panel ?Lab Results  ?Component Value Date/Time  ? CHOL 119 08/16/2020 08:35 AM  ? TRIG 228 (H) 08/16/2020 08:35 AM  ? HDL 27 (L) 08/16/2020 08:35 AM  ? CHOLHDL 4.4 08/16/2020 08:35 AM  ? CHOLHDL 5.4 (H)   05/16/2016 10:22 AM  ? LDLCALC 55 08/16/2020 08:35 AM  ? ? ?Wt Readings from Last 3 Encounters:  ?07/05/21 252 lb 3.2 oz (114.4 kg)  ?08/16/20 246 lb (111.6 kg)  ?08/03/19 261 lb (118.4 kg)  ?  ? ?Objective:   ? ?Vital Signs:  BP 138/84   Pulse 72   Ht 6' (1.829 m)   Wt 252 lb 3.2 oz (114.4 kg)   SpO2 95%   BMI 34.20 kg/m?   ? ? ?General: Alert, oriented x3, no distress, moderately obese ?Head: no evidence of trauma, PERRL, EOMI, no exophtalmos or lid lag, no myxedema, no xanthelasma; normal ears, nose and oropharynx ?Neck: normal jugular venous pulsations and no hepatojugular reflux; brisk carotid pulses without delay and no carotid bruits ?Chest: clear to auscultation, no signs of consolidation by percussion or palpation, normal fremitus, symmetrical and full respiratory excursions ?Cardiovascular: normal position and quality of the apical impulse, regular rhythm, normal first and widely split second heart sounds, no murmurs, rubs or gallops ?Abdomen: no tenderness or distention, no masses by palpation, no abnormal  pulsatility or arterial bruits, normal bowel sounds, no hepatosplenomegaly ?Extremities: no clubbing, cyanosis or edema; 2+ radial, ulnar and brachial pulses bilaterally; 2+ right femoral, posterior tibial and dorsalis pedis puls

## 2021-07-06 LAB — LIPID PANEL
Chol/HDL Ratio: 4.5 ratio (ref 0.0–5.0)
Cholesterol, Total: 117 mg/dL (ref 100–199)
HDL: 26 mg/dL — ABNORMAL LOW (ref >39)
LDL Chol Calc (NIH): 60 mg/dL (ref 0–99)
Triglycerides: 186 mg/dL — ABNORMAL HIGH (ref 0–149)
VLDL Cholesterol Cal: 31 mg/dL (ref 5–40)

## 2021-07-06 LAB — BASIC METABOLIC PANEL
BUN/Creatinine Ratio: 14 (ref 10–24)
BUN: 15 mg/dL (ref 8–27)
CO2: 24 mmol/L (ref 20–29)
Calcium: 9.7 mg/dL (ref 8.6–10.2)
Chloride: 97 mmol/L (ref 96–106)
Creatinine, Ser: 1.05 mg/dL (ref 0.76–1.27)
Glucose: 116 mg/dL — ABNORMAL HIGH (ref 70–99)
Potassium: 4.1 mmol/L (ref 3.5–5.2)
Sodium: 140 mmol/L (ref 134–144)
eGFR: 81 mL/min/{1.73_m2} (ref >59)

## 2021-07-06 LAB — HEMOGLOBIN A1C
Est. average glucose Bld gHb Est-mCnc: 148 mg/dL
Hgb A1c MFr Bld: 6.8 % — ABNORMAL HIGH (ref 4.8–5.6)

## 2021-07-12 DIAGNOSIS — G4733 Obstructive sleep apnea (adult) (pediatric): Secondary | ICD-10-CM | POA: Diagnosis not present

## 2021-07-17 ENCOUNTER — Telehealth (HOSPITAL_COMMUNITY): Payer: Self-pay | Admitting: Emergency Medicine

## 2021-07-17 NOTE — Telephone Encounter (Signed)
Reaching out to patient to offer assistance regarding upcoming cardiac imaging study; pt verbalizes understanding of appt date/time, parking situation and where to check in, pre-test NPO status and medications ordered, and verified current allergies; name and call back number provided for further questions should they arise ?Adam Alexandria RN Navigator Cardiac Imaging ?[facility] ?[phone] office ?[phone] cell ? ?Denies iv issues ?100mg  metoprolol tartrate ?Holding olmesartan, HCTZ, viagra ?Arrival 200 ?

## 2021-07-18 ENCOUNTER — Encounter: Payer: Self-pay | Admitting: *Deleted

## 2021-07-18 ENCOUNTER — Other Ambulatory Visit: Payer: Self-pay | Admitting: *Deleted

## 2021-07-18 ENCOUNTER — Ambulatory Visit (HOSPITAL_COMMUNITY)
Admission: RE | Admit: 2021-07-18 | Discharge: 2021-07-18 | Disposition: A | Discharge status: Home / Self Care | Payer: BC Managed Care – PPO | Source: Ambulatory Visit | Attending: Cardiovascular Disease | Admitting: Cardiovascular Disease

## 2021-07-18 ENCOUNTER — Other Ambulatory Visit: Payer: Self-pay | Admitting: Internal Medicine

## 2021-07-18 DIAGNOSIS — I2510 Atherosclerotic heart disease of native coronary artery without angina pectoris: Secondary | ICD-10-CM | POA: Diagnosis not present

## 2021-07-18 DIAGNOSIS — I25118 Atherosclerotic heart disease of native coronary artery with other forms of angina pectoris: Secondary | ICD-10-CM

## 2021-07-18 DIAGNOSIS — R072 Precordial pain: Secondary | ICD-10-CM | POA: Diagnosis not present

## 2021-07-18 DIAGNOSIS — R931 Abnormal findings on diagnostic imaging of heart and coronary circulation: Secondary | ICD-10-CM | POA: Insufficient documentation

## 2021-07-18 MED ORDER — SODIUM CHLORIDE 0.9% FLUSH: 3.0000 mL | Freq: Two times a day (BID) | INTRAVENOUS | Status: AC

## 2021-07-18 MED ORDER — NITROGLYCERIN 0.4 MG SL SUBL
0.8000 mg | SUBLINGUAL_TABLET | Freq: Once | SUBLINGUAL | Status: AC
  Administered 2021-07-18: 0.8 mg via SUBLINGUAL

## 2021-07-18 MED ORDER — IOHEXOL 350 MG/ML SOLN
100.0000 mL | Freq: Once | INTRAVENOUS | Status: AC | PRN
Start: 2021-07-18 — End: 2021-07-18
  Administered 2021-07-18: 100 mL via INTRAVENOUS

## 2021-07-18 MED ORDER — NITROGLYCERIN 0.4 MG SL SUBL
SUBLINGUAL_TABLET | SUBLINGUAL | Status: AC
  Filled 2021-07-18: qty 2

## 2021-07-18 NOTE — Progress Notes (Signed)
Please send CT for FFR - Dr. Camauri Fleece 

## 2021-07-19 ENCOUNTER — Other Ambulatory Visit: Payer: Self-pay

## 2021-07-19 ENCOUNTER — Encounter (HOSPITAL_COMMUNITY)
Admission: RE | Disposition: A | Discharge status: Home / Self Care | Payer: BC Managed Care – PPO | Source: Home / Self Care | Attending: Interventional Cardiology

## 2021-07-19 ENCOUNTER — Encounter (HOSPITAL_COMMUNITY): Payer: Self-pay | Admitting: Interventional Cardiology

## 2021-07-19 ENCOUNTER — Ambulatory Visit (HOSPITAL_COMMUNITY)
Admission: RE | Admit: 2021-07-19 | Discharge: 2021-07-19 | Disposition: A | Discharge status: Home / Self Care | Payer: BC Managed Care – PPO | Source: Ambulatory Visit | Attending: Internal Medicine | Admitting: Internal Medicine

## 2021-07-19 ENCOUNTER — Ambulatory Visit (HOSPITAL_BASED_OUTPATIENT_CLINIC_OR_DEPARTMENT_OTHER)
Admission: RE | Admit: 2021-07-19 | Discharge: 2021-07-19 | Disposition: A | Discharge status: Home / Self Care | Payer: BC Managed Care – PPO | Source: Ambulatory Visit | Attending: Internal Medicine | Admitting: Internal Medicine

## 2021-07-19 ENCOUNTER — Ambulatory Visit (HOSPITAL_COMMUNITY)
Admission: RE | Admit: 2021-07-19 | Discharge: 2021-07-20 | Disposition: A | Discharge status: Home / Self Care | Payer: BC Managed Care – PPO | Attending: Interventional Cardiology | Admitting: Interventional Cardiology

## 2021-07-19 DIAGNOSIS — I25118 Atherosclerotic heart disease of native coronary artery with other forms of angina pectoris: Secondary | ICD-10-CM | POA: Insufficient documentation

## 2021-07-19 DIAGNOSIS — Z87891 Personal history of nicotine dependence: Secondary | ICD-10-CM | POA: Insufficient documentation

## 2021-07-19 DIAGNOSIS — E119 Type 2 diabetes mellitus without complications: Secondary | ICD-10-CM | POA: Diagnosis not present

## 2021-07-19 DIAGNOSIS — G4733 Obstructive sleep apnea (adult) (pediatric): Secondary | ICD-10-CM | POA: Insufficient documentation

## 2021-07-19 DIAGNOSIS — R072 Precordial pain: Secondary | ICD-10-CM | POA: Diagnosis not present

## 2021-07-19 DIAGNOSIS — R931 Abnormal findings on diagnostic imaging of heart and coronary circulation: Secondary | ICD-10-CM | POA: Insufficient documentation

## 2021-07-19 DIAGNOSIS — I5042 Chronic combined systolic (congestive) and diastolic (congestive) heart failure: Secondary | ICD-10-CM | POA: Diagnosis not present

## 2021-07-19 DIAGNOSIS — N529 Male erectile dysfunction, unspecified: Secondary | ICD-10-CM | POA: Diagnosis not present

## 2021-07-19 DIAGNOSIS — I252 Old myocardial infarction: Secondary | ICD-10-CM | POA: Diagnosis not present

## 2021-07-19 DIAGNOSIS — E785 Hyperlipidemia, unspecified: Secondary | ICD-10-CM | POA: Diagnosis not present

## 2021-07-19 DIAGNOSIS — I209 Angina pectoris, unspecified: Secondary | ICD-10-CM | POA: Diagnosis present

## 2021-07-19 DIAGNOSIS — Z7982 Long term (current) use of aspirin: Secondary | ICD-10-CM | POA: Diagnosis not present

## 2021-07-19 DIAGNOSIS — Z6834 Body mass index (BMI) 34.0-34.9, adult: Secondary | ICD-10-CM | POA: Insufficient documentation

## 2021-07-19 DIAGNOSIS — Z79899 Other long term (current) drug therapy: Secondary | ICD-10-CM | POA: Insufficient documentation

## 2021-07-19 DIAGNOSIS — I110 Hypertensive heart disease with heart failure: Secondary | ICD-10-CM | POA: Diagnosis not present

## 2021-07-19 DIAGNOSIS — I2510 Atherosclerotic heart disease of native coronary artery without angina pectoris: Secondary | ICD-10-CM | POA: Diagnosis present

## 2021-07-19 DIAGNOSIS — E876 Hypokalemia: Secondary | ICD-10-CM | POA: Diagnosis not present

## 2021-07-19 DIAGNOSIS — Z7902 Long term (current) use of antithrombotics/antiplatelets: Secondary | ICD-10-CM | POA: Diagnosis not present

## 2021-07-19 HISTORY — PX: INTRAVASCULAR ULTRASOUND/IVUS: CATH118244

## 2021-07-19 HISTORY — PX: LEFT HEART CATH AND CORONARY ANGIOGRAPHY: CATH118249

## 2021-07-19 HISTORY — PX: CORONARY ATHERECTOMY: CATH118238

## 2021-07-19 HISTORY — PX: CORONARY STENT INTERVENTION: CATH118234

## 2021-07-19 LAB — CBC
HCT: 43.9 % (ref 39.0–52.0)
Hemoglobin: 15.2 g/dL (ref 13.0–17.0)
MCH: 30.4 pg (ref 26.0–34.0)
MCHC: 34.6 g/dL (ref 30.0–36.0)
MCV: 87.8 fL (ref 80.0–100.0)
Platelets: 211 10*3/uL (ref 150–400)
RBC: 5 MIL/uL (ref 4.22–5.81)
RDW: 13.5 % (ref 11.5–15.5)
WBC: 7.3 10*3/uL (ref 4.0–10.5)
nRBC: 0 % (ref 0.0–0.2)

## 2021-07-19 LAB — POCT ACTIVATED CLOTTING TIME
Activated Clotting Time: 341 seconds
Activated Clotting Time: 281 seconds
Activated Clotting Time: 317 seconds
Activated Clotting Time: 335 seconds

## 2021-07-19 LAB — GLUCOSE, CAPILLARY
Glucose-Capillary: 160 mg/dL — ABNORMAL HIGH (ref 70–99)
Glucose-Capillary: 141 mg/dL — ABNORMAL HIGH (ref 70–99)
Glucose-Capillary: 133 mg/dL — ABNORMAL HIGH (ref 70–99)
Glucose-Capillary: 118 mg/dL — ABNORMAL HIGH (ref 70–99)

## 2021-07-19 LAB — CREATININE, SERUM
Creatinine, Ser: 1.07 mg/dL (ref 0.61–1.24)
GFR, Estimated: >60 mL/min (ref >60)

## 2021-07-19 SURGERY — LEFT HEART CATH AND CORONARY ANGIOGRAPHY
Anesthesia: LOCAL

## 2021-07-19 MED ORDER — SODIUM CHLORIDE 0.9 % WEIGHT BASED INFUSION
3.0000 mL/kg/h | INTRAVENOUS | Status: DC
  Administered 2021-07-19: 3 mL/kg/h via INTRAVENOUS

## 2021-07-19 MED ORDER — HEPARIN SODIUM (PORCINE) 5000 UNIT/ML IJ SOLN
5000.0000 [IU] | Freq: Three times a day (TID) | INTRAMUSCULAR | Status: DC
  Administered 2021-07-19: 5000 [IU] via SUBCUTANEOUS
  Filled 2021-07-19 (×2): qty 1

## 2021-07-19 MED ORDER — FENTANYL CITRATE (PF) 100 MCG/2ML IJ SOLN
INTRAMUSCULAR | Status: DC | PRN
  Administered 2021-07-19 (×2): 25 ug via INTRAVENOUS

## 2021-07-19 MED ORDER — CLOPIDOGREL BISULFATE 300 MG PO TABS
ORAL_TABLET | ORAL | Status: AC
  Filled 2021-07-19: qty 2

## 2021-07-19 MED ORDER — HEPARIN (PORCINE) IN NACL 1000-0.9 UT/500ML-% IV SOLN
INTRAVENOUS | Status: DC | PRN
  Administered 2021-07-19 (×2): 500 mL

## 2021-07-19 MED ORDER — NITROGLYCERIN 1 MG/10 ML FOR IR/CATH LAB
INTRA_ARTERIAL | Status: DC | PRN
  Administered 2021-07-19 (×2): 200 ug via INTRACORONARY

## 2021-07-19 MED ORDER — HEPARIN (PORCINE) IN NACL 1000-0.9 UT/500ML-% IV SOLN
INTRAVENOUS | Status: AC
  Filled 2021-07-19: qty 1000

## 2021-07-19 MED ORDER — HYDRALAZINE HCL 20 MG/ML IJ SOLN: 10.0000 mg | INTRAMUSCULAR | Status: AC | PRN

## 2021-07-19 MED ORDER — CLOPIDOGREL BISULFATE 75 MG PO TABS
75.0000 mg | ORAL_TABLET | Freq: Every day | ORAL | Status: DC
  Administered 2021-07-20: 75 mg via ORAL
  Filled 2021-07-19: qty 1

## 2021-07-19 MED ORDER — SODIUM CHLORIDE 0.9 % IV SOLN: 250.0000 mL | INTRAVENOUS | Status: DC | PRN

## 2021-07-19 MED ORDER — SODIUM CHLORIDE 0.9 % IV SOLN: INTRAVENOUS | Status: AC

## 2021-07-19 MED ORDER — FAMOTIDINE IN NACL 20-0.9 MG/50ML-% IV SOLN
INTRAVENOUS | Status: DC | PRN
Start: 2021-07-19 — End: 2021-07-19
  Administered 2021-07-19: 20 mg via INTRAVENOUS

## 2021-07-19 MED ORDER — SODIUM CHLORIDE 0.9% FLUSH: 3.0000 mL | INTRAVENOUS | Status: DC | PRN

## 2021-07-19 MED ORDER — MIDAZOLAM HCL 2 MG/2ML IJ SOLN
INTRAMUSCULAR | Status: AC
  Filled 2021-07-19: qty 2

## 2021-07-19 MED ORDER — SODIUM CHLORIDE 0.9% FLUSH: 3.0000 mL | Freq: Two times a day (BID) | INTRAVENOUS | Status: DC

## 2021-07-19 MED ORDER — OXYCODONE HCL 5 MG PO TABS: 5.0000 mg | ORAL_TABLET | ORAL | Status: DC | PRN

## 2021-07-19 MED ORDER — FAMOTIDINE IN NACL 20-0.9 MG/50ML-% IV SOLN
INTRAVENOUS | Status: AC
  Filled 2021-07-19: qty 50

## 2021-07-19 MED ORDER — SODIUM CHLORIDE 0.9 % WEIGHT BASED INFUSION: 1.0000 mL/kg/h | INTRAVENOUS | Status: DC

## 2021-07-19 MED ORDER — METOPROLOL SUCCINATE ER 25 MG PO TB24
100.0000 mg | ORAL_TABLET | Freq: Every day | ORAL | Status: DC
  Administered 2021-07-20: 100 mg via ORAL
  Filled 2021-07-19: qty 4

## 2021-07-19 MED ORDER — IOHEXOL 350 MG/ML SOLN
INTRAVENOUS | Status: DC | PRN
  Administered 2021-07-19: 330 mL via INTRA_ARTERIAL

## 2021-07-19 MED ORDER — HEPARIN SODIUM (PORCINE) 1000 UNIT/ML IJ SOLN
INTRAMUSCULAR | Status: AC
  Filled 2021-07-19: qty 10

## 2021-07-19 MED ORDER — LIDOCAINE HCL (PF) 1 % IJ SOLN
INTRAMUSCULAR | Status: AC
  Filled 2021-07-19: qty 30

## 2021-07-19 MED ORDER — VERAPAMIL HCL 2.5 MG/ML IV SOLN
INTRAVENOUS | Status: DC | PRN
  Administered 2021-07-19: 10 mL via INTRA_ARTERIAL

## 2021-07-19 MED ORDER — HYDROCHLOROTHIAZIDE 25 MG PO TABS
25.0000 mg | ORAL_TABLET | Freq: Every day | ORAL | Status: DC
Start: 2021-07-20 — End: 2021-07-20
  Administered 2021-07-20: 25 mg via ORAL
  Filled 2021-07-19: qty 1

## 2021-07-19 MED ORDER — ASPIRIN 81 MG PO CHEW
81.0000 mg | CHEWABLE_TABLET | Freq: Every day | ORAL | Status: DC
  Administered 2021-07-20: 81 mg via ORAL
  Filled 2021-07-19: qty 1

## 2021-07-19 MED ORDER — VIPERSLIDE LUBRICANT OPTIME: TOPICAL | Status: DC | PRN

## 2021-07-19 MED ORDER — CLOPIDOGREL BISULFATE 300 MG PO TABS
ORAL_TABLET | ORAL | Status: DC | PRN
  Administered 2021-07-19: 600 mg via ORAL

## 2021-07-19 MED ORDER — HEPARIN SODIUM (PORCINE) 1000 UNIT/ML IJ SOLN
INTRAMUSCULAR | Status: DC | PRN
  Administered 2021-07-19: 2000 [IU] via INTRAVENOUS
  Administered 2021-07-19 (×2): 2500 [IU] via INTRAVENOUS
  Administered 2021-07-19 (×2): 6000 [IU] via INTRAVENOUS

## 2021-07-19 MED ORDER — VERAPAMIL HCL 2.5 MG/ML IV SOLN
INTRAVENOUS | Status: AC
  Filled 2021-07-19: qty 2

## 2021-07-19 MED ORDER — ASPIRIN 81 MG PO CHEW: 81.0000 mg | CHEWABLE_TABLET | ORAL | Status: DC

## 2021-07-19 MED ORDER — METFORMIN HCL 500 MG PO TABS: 500.0000 mg | ORAL_TABLET | Freq: Two times a day (BID) | ORAL | Status: DC

## 2021-07-19 MED ORDER — ONDANSETRON HCL 4 MG/2ML IJ SOLN: 4.0000 mg | Freq: Four times a day (QID) | INTRAMUSCULAR | Status: DC | PRN

## 2021-07-19 MED ORDER — FENTANYL CITRATE (PF) 100 MCG/2ML IJ SOLN
INTRAMUSCULAR | Status: AC
  Filled 2021-07-19: qty 2

## 2021-07-19 MED ORDER — IRBESARTAN 300 MG PO TABS
300.0000 mg | ORAL_TABLET | Freq: Every day | ORAL | Status: DC
  Administered 2021-07-20: 300 mg via ORAL
  Filled 2021-07-19: qty 1

## 2021-07-19 MED ORDER — ACETAMINOPHEN 325 MG PO TABS: 650.0000 mg | ORAL_TABLET | ORAL | Status: DC | PRN

## 2021-07-19 MED ORDER — LIDOCAINE HCL (PF) 1 % IJ SOLN
INTRAMUSCULAR | Status: DC | PRN
  Administered 2021-07-19: 2 mL

## 2021-07-19 MED ORDER — DAPAGLIFLOZIN PROPANEDIOL 10 MG PO TABS
10.0000 mg | ORAL_TABLET | Freq: Every day | ORAL | Status: DC
  Administered 2021-07-20: 10 mg via ORAL
  Filled 2021-07-19: qty 1

## 2021-07-19 MED ORDER — ATORVASTATIN CALCIUM 80 MG PO TABS
80.0000 mg | ORAL_TABLET | Freq: Every day | ORAL | Status: DC
  Administered 2021-07-19 – 2021-07-20 (×2): 80 mg via ORAL
  Filled 2021-07-19 (×2): qty 1

## 2021-07-19 MED ORDER — LABETALOL HCL 5 MG/ML IV SOLN: 10.0000 mg | INTRAVENOUS | Status: AC | PRN

## 2021-07-19 MED ORDER — MIDAZOLAM HCL 2 MG/2ML IJ SOLN
INTRAMUSCULAR | Status: DC | PRN
  Administered 2021-07-19: 1 mg via INTRAVENOUS
  Administered 2021-07-19: .5 mg via INTRAVENOUS

## 2021-07-19 MED ORDER — NITROGLYCERIN 1 MG/10 ML FOR IR/CATH LAB
INTRA_ARTERIAL | Status: AC
  Filled 2021-07-19: qty 10

## 2021-07-19 SURGICAL SUPPLY — 29 items
BALLN EUPHORA RX 2.0X12 (BALLOONS) ×2
BALLN SAPPHIRE 2.75X15 (BALLOONS) ×2
BALLN SCOREFLEX 3.0X15 (BALLOONS) ×2
BALLN ~~LOC~~ EUPHORA RX 3.5X12 (BALLOONS) ×2
BALLOON EUPHORA RX 2.0X12 (BALLOONS) IMPLANT
BALLOON SAPPHIRE 2.75X15 (BALLOONS) IMPLANT
BALLOON SCOREFLEX 3.0X15 (BALLOONS) IMPLANT
BALLOON ~~LOC~~ EUPHORA RX 3.5X12 (BALLOONS) IMPLANT
BAND ZEPHYR COMPRESS 30 LONG (HEMOSTASIS) ×1 IMPLANT
CATH 5FR JR4 DIAGNOSTIC (CATHETERS) ×1 IMPLANT
CATH JL3.5 FR DIAG (CATHETERS) ×1 IMPLANT
CATH OPTICROSS HD (CATHETERS) ×1 IMPLANT
CATH TELEPORT (CATHETERS) ×1 IMPLANT
CATH VISTA GUIDE 6FR XBLAD3.5 (CATHETERS) ×1 IMPLANT
CROWN DIAMONDBACK CLASSIC 1.25 (BURR) ×1 IMPLANT
GLIDESHEATH SLEND A-KIT 6F 22G (SHEATH) ×1 IMPLANT
GUIDEWIRE INQWIRE 1.5J.035X260 (WIRE) IMPLANT
INQWIRE 1.5J .035X260CM (WIRE) ×2
KIT ENCORE 26 ADVANTAGE (KITS) ×1 IMPLANT
KIT HEART LEFT (KITS) ×2 IMPLANT
PACK CARDIAC CATHETERIZATION (CUSTOM PROCEDURE TRAY) ×2 IMPLANT
SLED PULL BACK IVUS (MISCELLANEOUS) ×1 IMPLANT
STENT SYNERGY XD 3.0X48 (Permanent Stent) IMPLANT
SYNERGY XD 3.0X48 (Permanent Stent) ×2 IMPLANT
TRANSDUCER W/STOPCOCK (MISCELLANEOUS) ×2 IMPLANT
TUBING CIL FLEX 10 FLL-RA (TUBING) ×2 IMPLANT
WIRE ASAHI PROWATER 180CM (WIRE) ×1 IMPLANT
WIRE GUIDE ASAHI EXTENSION 165 (WIRE) ×1 IMPLANT
WIRE VIPERWIRE COR FLEX .012 (WIRE) ×1 IMPLANT

## 2021-07-19 NOTE — H&P (Signed)
Completed.

## 2021-07-19 NOTE — CV Procedure (Signed)
Ectatic coronary arteries.  Right coronary is dominant and free of obstructive disease. ?Left main and circumflex are free of obstructive disease. ?LAD in the proximal to mid segment contains a [place] 011 bifurcation stenosis, 0%, 99%, 40%.  The mid LAD beyond the bifurcation contains 99% stenosis.  Both regions of disease are heavily calcified. ?Orbital atherectomy of proximal to mid LAD followed by placement of a 3.0 x 48 Synergy stent postdilated to 3.5 mm in diameter in the proximal margin..  An attempt was made to avoid jailing the large diagonal. ?Recommendation: Overnight stay given length of stent and duration of procedure.  Continue aspirin and Plavix.  Intensify statin therapy. ?

## 2021-07-19 NOTE — Interval H&P Note (Signed)
Cath Lab Visit (complete for each Cath Lab visit) ? ?Clinical Evaluation Leading to the Procedure:  ? ?ACS: Yes.   ? ?Non-ACS:   ? ?Anginal Classification: CCS Hunter ? ?Anti-ischemic medical therapy: Minimal Therapy (1 class of medications) ? ?Non-Invasive Test Results: No non-invasive testing performed ? ?Prior CABG: No previous CABG ? ? ? ? ? ?History and Physical Interval Note: ? ?07/19/2021 ?2:34 PM ? ?Adam Hunter  has presented today for surgery, with the diagnosis of * No surgery found *.  The various methods of treatment have been discussed with the patient and family. After consideration of risks, benefits and other options for treatment, the patient has consented to  * No surgery found * as a surgical intervention.  The patient's history has been reviewed, patient examined, no change in status, stable for surgery.  I have reviewed the patient's chart and labs.  Questions were answered to the patient's satisfaction.   ? ? ?Adam Hunter ? ? ?

## 2021-07-20 DIAGNOSIS — E785 Hyperlipidemia, unspecified: Secondary | ICD-10-CM | POA: Diagnosis not present

## 2021-07-20 DIAGNOSIS — Z87891 Personal history of nicotine dependence: Secondary | ICD-10-CM | POA: Diagnosis not present

## 2021-07-20 DIAGNOSIS — I5042 Chronic combined systolic (congestive) and diastolic (congestive) heart failure: Secondary | ICD-10-CM | POA: Diagnosis not present

## 2021-07-20 DIAGNOSIS — E119 Type 2 diabetes mellitus without complications: Secondary | ICD-10-CM | POA: Diagnosis not present

## 2021-07-20 DIAGNOSIS — I252 Old myocardial infarction: Secondary | ICD-10-CM | POA: Diagnosis not present

## 2021-07-20 DIAGNOSIS — R931 Abnormal findings on diagnostic imaging of heart and coronary circulation: Secondary | ICD-10-CM | POA: Diagnosis not present

## 2021-07-20 DIAGNOSIS — N529 Male erectile dysfunction, unspecified: Secondary | ICD-10-CM | POA: Diagnosis not present

## 2021-07-20 DIAGNOSIS — I110 Hypertensive heart disease with heart failure: Secondary | ICD-10-CM | POA: Diagnosis not present

## 2021-07-20 DIAGNOSIS — I25118 Atherosclerotic heart disease of native coronary artery with other forms of angina pectoris: Secondary | ICD-10-CM | POA: Diagnosis not present

## 2021-07-20 DIAGNOSIS — Z6834 Body mass index (BMI) 34.0-34.9, adult: Secondary | ICD-10-CM | POA: Diagnosis not present

## 2021-07-20 DIAGNOSIS — E876 Hypokalemia: Secondary | ICD-10-CM | POA: Diagnosis not present

## 2021-07-20 DIAGNOSIS — Z79899 Other long term (current) drug therapy: Secondary | ICD-10-CM | POA: Diagnosis not present

## 2021-07-20 DIAGNOSIS — Z7982 Long term (current) use of aspirin: Secondary | ICD-10-CM | POA: Diagnosis not present

## 2021-07-20 DIAGNOSIS — I2510 Atherosclerotic heart disease of native coronary artery without angina pectoris: Secondary | ICD-10-CM | POA: Diagnosis present

## 2021-07-20 DIAGNOSIS — I209 Angina pectoris, unspecified: Secondary | ICD-10-CM | POA: Diagnosis present

## 2021-07-20 DIAGNOSIS — Z7902 Long term (current) use of antithrombotics/antiplatelets: Secondary | ICD-10-CM | POA: Diagnosis not present

## 2021-07-20 DIAGNOSIS — G4733 Obstructive sleep apnea (adult) (pediatric): Secondary | ICD-10-CM | POA: Diagnosis not present

## 2021-07-20 LAB — CBC
HCT: 38.1 % — ABNORMAL LOW (ref 39.0–52.0)
Hemoglobin: 13 g/dL (ref 13.0–17.0)
MCH: 30.7 pg (ref 26.0–34.0)
MCHC: 34.1 g/dL (ref 30.0–36.0)
MCV: 89.9 fL (ref 80.0–100.0)
Platelets: 209 10*3/uL (ref 150–400)
RBC: 4.24 MIL/uL (ref 4.22–5.81)
RDW: 13.6 % (ref 11.5–15.5)
WBC: 7.3 10*3/uL (ref 4.0–10.5)
nRBC: 0 % (ref 0.0–0.2)

## 2021-07-20 LAB — BASIC METABOLIC PANEL
Anion gap: 8 (ref 5–15)
BUN: 16 mg/dL (ref 6–20)
CO2: 26 mmol/L (ref 22–32)
Calcium: 8.6 mg/dL — ABNORMAL LOW (ref 8.9–10.3)
Chloride: 104 mmol/L (ref 98–111)
Creatinine, Ser: 1.12 mg/dL (ref 0.61–1.24)
GFR, Estimated: >60 mL/min (ref >60)
Glucose, Bld: 154 mg/dL — ABNORMAL HIGH (ref 70–99)
Potassium: 3.2 mmol/L — ABNORMAL LOW (ref 3.5–5.1)
Sodium: 138 mmol/L (ref 135–145)

## 2021-07-20 LAB — GLUCOSE, CAPILLARY
Glucose-Capillary: 162 mg/dL — ABNORMAL HIGH (ref 70–99)
Glucose-Capillary: 164 mg/dL — ABNORMAL HIGH (ref 70–99)

## 2021-07-20 MED ORDER — POTASSIUM CHLORIDE CRYS ER 20 MEQ PO TBCR
40.0000 meq | EXTENDED_RELEASE_TABLET | Freq: Once | ORAL | Status: AC
  Administered 2021-07-20: 40 meq via ORAL
  Filled 2021-07-20: qty 2

## 2021-07-20 MED ORDER — PANTOPRAZOLE SODIUM 40 MG PO TBEC: 40.0000 mg | DELAYED_RELEASE_TABLET | Freq: Every day | ORAL | 1 refills | Status: DC

## 2021-07-20 MED ORDER — ATORVASTATIN CALCIUM 40 MG PO TABS
40.0000 mg | ORAL_TABLET | Freq: Every day | ORAL | 6 refills | Status: DC
Start: 2021-07-20 — End: 2021-08-21

## 2021-07-20 MED ORDER — NITROGLYCERIN 0.4 MG SL SUBL: 0.4000 mg | SUBLINGUAL_TABLET | SUBLINGUAL | 12 refills | Status: AC | PRN

## 2021-07-20 NOTE — Progress Notes (Signed)
Patient is waiting for Md to come assess and answer questions before discharging. Md is currently in a procedure. ? ?Adam Hunter Seen SWOT RN ?

## 2021-07-20 NOTE — Progress Notes (Signed)
CARDIAC REHAB PHASE I  ? ?PRE:  Rate/Rhythm: 61 SR with PVCs ? ?  BP: sitting 131/92 ? ?  SaO2:  ? ?MODE:  Ambulation: 470 ft  ? ?POST:  Rate/Rhythm: 108 ST ? ?  BP: sitting 151/95  ? ?  SaO2:  ? ?Pt ambulated without c/o. BP elevated. Discussed stent, Plavix, restrictions, diet, exercise, NTG and CRPII. Pt receptive, he watches his diet and walks 30 min qd. Will refer to [facility] CRPII ?[id number]  ? ?Adam Hunter CES, ACSM ?07/20/2021 ?9:00 AM ? ? ? ? ?

## 2021-07-20 NOTE — Discharge Summary (Addendum)
?Discharge Summary  ?  ?Patient ID: Adam Hunter ?MRN: [id number]; DOB: 1960-10-28 ? ?Admit date: 07/19/2021 ?Discharge date: 07/20/2021 ? ?PCP:  Leonard Downing, MD ?  ?[facility] Providers ?Cardiologist:  Sanda Klein, MD   { ? ?Discharge Diagnoses  ?  ?Principal Problem: ?  CAD (coronary artery disease), native coronary artery ?Active Problems: ?  CAD (coronary artery disease) ?  Hyperlipidemia ?  Morbid obesity ([place]) ?  Chronic combined CHF ?  HTN ? Hypokalemia ? ?Diagnostic Studies/Procedures  ?  ?CORONARY STENT INTERVENTION  ?CORONARY ATHERECTOMY  ?Intravascular Ultrasound/IVUS  ?LEFT HEART CATH AND CORONARY ANGIOGRAPHY  ? ?Conclusion ? ?CONCLUSIONS: ?Proximal and mid 99% LAD stenoses within a calcified segment and resultant TIMI grade II flow.  Following orbital atherectomy of both lesions, followed by balloon expansion using a Score flex scoring balloon, a Synergy 3.0 x 48 mm drug-eluting stent was deployed at 14 atm.  The proximal margin of the stent was postdilated at high pressure with a 3.5 mm balloon.  0% stenosis and TIMI grade III flow was noted. ?Coronaries otherwise ectatic but without obstructive disease.  The large first diagonal contains 50% obstruction. ?Mild to moderate hypokinesis is noted in the mid anterior wall.  EF 50% ?LVEDP 10 mmHg ?  ?RECOMMENDATIONS: ?  ?Aggressive risk factor modification.  High intensity statin therapy. ?Because of the duration of the procedure and contrast used, patient will spend the night and be discharged in a.m. ?  ?  ?  ?History of Present Illness   ?  ?Adam Hunter is a 61 y.o. male with CAD, chronic combined CHF, hypertension, dyslipidemia with severely decreased HDL, obstructive sleep apnea on CPAP and morbid obesity presented for outpatient cath.  ? ?History of acute ST segment elevation myocardial infarction October 2011 due to occlusion of a large septal branch of the LAD artery, treated medically.  He has not had any coronary events since that  time. ? ?Recently having chest pressure radiating through his back, between his shoulder blades, although this is different from his previous angina which was primarily discomfort in his left shoulder.  His exercise tolerance has diminished a little bit and he becomes short of breath after he walks for about 30 minutes each day. Reported elevated blood pressure.  ? ?His echocardiogram showed a reduction in left ventricular systolic function with an ejection fraction that was now estimated at 40-45%.  The report does not mention regional wall motion abnormalities.  On my review, the ejection fraction is probably little better, around 50%, but there is a suggestion of hypokinesis in the mid distal anterolateral wall, with sparing of the septum and true apex.  The mitral inflow pattern is of impaired relaxation, without overt evidence of elevated mean left atrial pressure.  There are no significant valvular abnormalities. ? ?Follow up coronary CT with coronary calcium score of 1915 with Severe CAD with subtotal occlusion of the proximal LAD, CADRADS = 4a. ?CT FFR does demonstrate significant discrete stenosis of the mid LAD which is flow-limiting.  ? ? ?Hospital Course  ?   ?Consultants: None  ? ?Cardiac cath showed proximal and mid 99% LAD stenoses within a calcified segment and resultant TIMI grade II flow.  Underwent succsesful orbital atherectomy of both lesions, followed by balloon expansion using a Score flex scoring balloon, a Synergy 3.0 x 48 mm drug-eluting stent was deployed at 14 atm. No complications. Kept overnight due to duration of the procedure and contrast used. No complications. Renal function and  hemoglobin are stable. Will suppliment for hypokalemia. He will ambulate with cardiac rehab. No change in home medications except increasing Lipitor to 40mg  qd and changing Prilosec to Protonix as patient on Plavix.  ? ?The patient been seen by Dr. Gasper Sells today and deemed ready for discharge home. All  follow-up appointments have been scheduled. Discharge medications are listed below.   ? ?Did the patient have an acute coronary syndrome (MI, NSTEMI, STEMI, etc) this admission?:  No                               ?Did the patient have a percutaneous coronary intervention (stent / angioplasty)?:  Yes.   ? ? ?Cath/PCI Registry Performance & Quality Measures: ?Aspirin prescribed? - Yes ?ADP Receptor Inhibitor (Plavix/Clopidogrel, Brilinta/Ticagrelor or Effient/Prasugrel) prescribed (includes medically managed patients)? - Yes ?High Intensity Statin (Lipitor 40-80mg  or Crestor 20-40mg ) prescribed? - Yes ?For EF <40%, was ACEI/ARB prescribed? - Yes ?For EF <40%, Aldosterone Antagonist (Spironolactone or Eplerenone) prescribed? - Not Applicable (EF >/= 20%) ?Cardiac Rehab Phase II ordered? - Yes  ? ? ?Discharge Vitals ?Blood pressure 120/79, pulse 87, temperature 97.8 ?F (36.6 ?C), temperature source Oral, resp. rate 18, height 6' (1.829 m), weight 113.4 kg, SpO2 95 %.  Danley Danker Weights  ? 07/19/21 0751  ?Weight: 113.4 kg  ? ?Physical Exam ?Constitutional:   ?   Appearance: Normal appearance.  ?HENT:  ?   Head: Normocephalic.  ?   Nose: Nose normal.  ?Eyes:  ?   Extraocular Movements: Extraocular movements intact.  ?   Pupils: Pupils are equal, round, and reactive to light.  ?Cardiovascular:  ?   Rate and Rhythm: Normal rate and regular rhythm.  ?   Comments: Radial cath site without hematoma  ?Pulmonary:  ?   Effort: Pulmonary effort is normal.  ?   Breath sounds: Normal breath sounds.  ?Abdominal:  ?   General: Abdomen is flat.  ?   Palpations: Abdomen is soft.  ?Musculoskeletal:     ?   General: Normal range of motion.  ?   Cervical back: Normal range of motion.  ?Skin: ?   General: Skin is warm and dry.  ?Neurological:  ?   General: No focal deficit present.  ?   Mental Status: He is alert and oriented to person, place, and time.  ?Psychiatric:     ?   Mood and Affect: Mood normal.     ?   Behavior: Behavior normal.  ?   ?Labs & Radiologic Studies  ?  ?CBC ?Recent Labs  ?  07/19/21 ?0903 07/20/21 ?0159  ?WBC 7.3 7.3  ?HGB 15.2 13.0  ?HCT 43.9 38.1*  ?MCV 87.8 89.9  ?PLT 211 209  ? ?Basic Metabolic Panel ?Recent Labs  ?  07/19/21 ?1442 07/20/21 ?0159  ?NA  --  138  ?K  --  3.2*  ?CL  --  104  ?CO2  --  26  ?GLUCOSE  --  154*  ?BUN  --  16  ?CREATININE 1.07 1.12  ?CALCIUM  --  8.6*  ? ?_____________  ?CARDIAC CATHETERIZATION ? ?Result Date: 07/19/2021 ?CONCLUSIONS: Proximal and mid 99% LAD stenoses within a calcified segment and resultant TIMI grade II flow.  Following orbital atherectomy of both lesions, followed by balloon expansion using a Score flex scoring balloon, a Synergy 3.0 x 48 mm drug-eluting stent was deployed at 14 atm.  The proximal margin of the stent was  postdilated at high pressure with a 3.5 mm balloon.  0% stenosis and TIMI grade III flow was noted. Coronaries otherwise ectatic but without obstructive disease.  The large first diagonal contains 50% obstruction. Mild to moderate hypokinesis is noted in the mid anterior wall.  EF 50% LVEDP 10 mmHg RECOMMENDATIONS: Aggressive risk factor modification.  High intensity statin therapy. Because of the duration of the procedure and contrast used, patient will spend the night and be discharged in a.m.  ? ?CT CORONARY MORPH W/CTA COR W/SCORE W/CA W/CM &/OR WO/CM ? ?Addendum Date: 07/18/2021   ?ADDENDUM REPORT: 07/18/2021 16:57 HISTORY: 61 yo male with chest pain, nonspecific EXAM: Cardiac/Coronary CTA TECHNIQUE: The patient was scanned on a [employer]. PROTOCOL: A 120 kV retrospective scan was triggered in the descending thoracic aorta at 111 HU's. Axial non-contrast 3 mm slices were carried out through the heart. The data set was analyzed on a dedicated work station and scored using the [place]. Gantry rotation speed was 250 msecs and collimation was .6 mm. Beta blockade and 0.8 mg of sl NTG was given. The 3D data set was reconstructed in 10% intervals of the  0-90% of the R-R cycle. Diastolic phases were analyzed on a dedicated work station using MPR, MIP and VRT modes. The patient received 152m OMNIPAQUE IOHEXOL 350 MG/ML SOLN of contrast. FINDINGS: Qual

## 2021-07-27 ENCOUNTER — Encounter: Payer: BC Managed Care – PPO | Attending: Cardiovascular Disease | Admitting: *Deleted

## 2021-07-27 ENCOUNTER — Encounter: Payer: Self-pay | Admitting: *Deleted

## 2021-07-27 DIAGNOSIS — Z955 Presence of coronary angioplasty implant and graft: Secondary | ICD-10-CM | POA: Insufficient documentation

## 2021-07-27 NOTE — Progress Notes (Signed)
Virtual orientation call completed today. he has an appointment on Date: 08/06/2021  for EP eval and gym Orientation.  Documentation of diagnosis can be found in [facility] Date: 07/19/2021 .  ? ?

## 2021-08-06 ENCOUNTER — Encounter: Payer: BC Managed Care – PPO | Admitting: *Deleted

## 2021-08-06 VITALS — Ht 71.0 in | Wt 247.3 lb

## 2021-08-06 DIAGNOSIS — Z955 Presence of coronary angioplasty implant and graft: Secondary | ICD-10-CM | POA: Diagnosis not present

## 2021-08-06 NOTE — Patient Instructions (Signed)
Patient Instructions ? ?Patient Details  ?Name: Adam Hunter ?MRN: [id number] ?Date of Birth: 11/24/1960 ?Referring Provider:  Thurmon Fair, MD ? ?Below are your personal goals for exercise, nutrition, and risk factors. Our goal is to help you stay on track towards obtaining and maintaining these goals. We will be discussing your progress on these goals with you throughout the program. ? ?Initial Exercise Prescription: ? Initial Exercise Prescription - 08/06/21 1400   ? ?  ? Date of Initial Exercise RX and Referring Provider  ? Date 08/06/21   ? Referring Provider Dr. Royann Shivers   ?  ? Oxygen  ? Maintain Oxygen Saturation 88% or higher   ?  ? Treadmill  ? MPH 3   ? Grade 1   ? Minutes 15   ? METs 3.71   ?  ? Recumbant Bike  ? Level 3   ? RPM 60   ? Minutes 15   ? METs 3.46   ?  ? NuStep  ? Level 4   ? SPM 80   ? Minutes 15   ? METs 3.46   ?  ? Elliptical  ? Level 1   ? Speed 3   ? Minutes 15   ? METs 3.46   ?  ? REL-XR  ? Level 3   ? Speed 50   ? Minutes 15   ? METs 3.46   ?  ? Biostep-RELP  ? Level 3   ? SPM 50   ? Minutes 15   ? METs 3.46   ?  ? Prescription Details  ? Frequency (times per week) 3   ? Duration Progress to 30 minutes of continuous aerobic without signs/symptoms of physical distress   ?  ? Intensity  ? THRR 40-80% of Max Heartrate 115-145   ? Ratings of Perceived Exertion 11-13   ? Perceived Dyspnea 0-4   ?  ? Progression  ? Progression Continue to progress workloads to maintain intensity without signs/symptoms of physical distress.   ?  ? Resistance Training  ? Training Prescription Yes   ? Weight 4   ? Reps 10-15   ? ?  ?  ? ?  ? ? ?Exercise Goals: ?Frequency: Be able to perform aerobic exercise two to three times per week in program working toward 2-5 days per week of home exercise. ? ?Intensity: Work with a perceived exertion of 11 (fairly light) - 15 (hard) while following your exercise prescription.  We will make changes to your prescription with you as you progress through the program. ?   ?Duration: Be able to do 30 to 45 minutes of continuous aerobic exercise in addition to a 5 minute warm-up and a 5 minute cool-down routine. ?  ?Nutrition Goals: ?Your personal nutrition goals will be established when you do your nutrition analysis with the dietician. ? ?The following are general nutrition guidelines to follow: ?Cholesterol < 200mg /day ?Sodium < 1500mg /day ?Fiber: Men over 50 yrs - 30 grams per day ? ?Personal Goals: ? Personal Goals and Risk Factors at Admission - 08/06/21 1447   ? ?  ? Core Components/Risk Factors/Patient Goals on Admission  ?  Weight Management Yes   ? Intervention Weight Management: Develop a combined nutrition and exercise program designed to reach desired caloric intake, while maintaining appropriate intake of nutrient and fiber, sodium and fats, and appropriate energy expenditure required for the weight goal.;Weight Management: Provide education and appropriate resources to help participant work on and attain dietary goals.;Weight Management/Obesity: Establish  reasonable short term and long term weight goals.;Obesity: Provide education and appropriate resources to help participant work on and attain dietary goals.   ? Admit Weight 247 lb 4.8 oz (112.2 kg)   ? Goal Weight: Short Term 240 lb (108.9 kg)   ? Goal Weight: Long Term 210 lb (95.3 kg)   ? Expected Outcomes Short Term: Continue to assess and modify interventions until short term weight is achieved;Long Term: Adherence to nutrition and physical activity/exercise program aimed toward attainment of established weight goal;Weight Loss: Understanding of general recommendations for a balanced deficit meal plan, which promotes 1-2 lb weight loss per week and includes a negative energy balance of [phone] kcal/d;Understanding of distribution of calorie intake throughout the day with the consumption of 4-5 meals/snacks;Understanding recommendations for meals to include 15-35% energy as protein, 25-35% energy from fat, 35-60%  energy from carbohydrates, less than 200mg  of dietary cholesterol, 20-35 gm of total fiber daily   ? Diabetes Yes   ? Intervention Provide education about signs/symptoms and action to take for hypo/hyperglycemia.;Provide education about proper nutrition, including hydration, and aerobic/resistive exercise prescription along with prescribed medications to achieve blood glucose in normal ranges: Fasting glucose 65-99 mg/dL   ? Expected Outcomes Short Term: Participant verbalizes understanding of the signs/symptoms and immediate care of hyper/hypoglycemia, proper foot care and importance of medication, aerobic/resistive exercise and nutrition plan for blood glucose control.;Long Term: Attainment of HbA1C < 7%.   ? Heart Failure Yes   ? Intervention Provide a combined exercise and nutrition program that is supplemented with education, support and counseling about heart failure. Directed toward relieving symptoms such as shortness of breath, decreased exercise tolerance, and extremity edema.   ? Expected Outcomes Improve functional capacity of life;Short term: Attendance in program 2-3 days a week with increased exercise capacity. Reported lower sodium intake. Reported increased fruit and vegetable intake. Reports medication compliance.;Short term: Daily weights obtained and reported for increase. Utilizing diuretic protocols set by physician.;Long term: Adoption of self-care skills and reduction of barriers for early signs and symptoms recognition and intervention leading to self-care maintenance.   ? Hypertension Yes   ? Intervention Provide education on lifestyle modifcations including regular physical activity/exercise, weight management, moderate sodium restriction and increased consumption of fresh fruit, vegetables, and low fat dairy, alcohol moderation, and smoking cessation.;Monitor prescription use compliance.   ? Expected Outcomes Short Term: Continued assessment and intervention until BP is < 140/57mm HG in  hypertensive participants. < 130/85mm HG in hypertensive participants with diabetes, heart failure or chronic kidney disease.;Long Term: Maintenance of blood pressure at goal levels.   ? Lipids Yes   ? Intervention Provide education and support for participant on nutrition & aerobic/resistive exercise along with prescribed medications to achieve LDL 70mg , HDL >40mg .   ? Expected Outcomes Short Term: Participant states understanding of desired cholesterol values and is compliant with medications prescribed. Participant is following exercise prescription and nutrition guidelines.;Long Term: Cholesterol controlled with medications as prescribed, with individualized exercise RX and with personalized nutrition plan. Value goals: LDL < 70mg , HDL > 40 mg.   ? ?  ?  ? ?  ? ? ?Tobacco Use Initial Evaluation: ?Social History  ? ?Tobacco Use  ?Smoking Status Former  ? Packs/day: 1.50  ? Years: 30.00  ? Pack years: 45.00  ? Types: Cigarettes  ? Quit date: 01/22/2010  ? Years since quitting: 11.5  ?Smokeless Tobacco Former  ? ? ?Exercise Goals and Review: ? Exercise Goals   ? ? Row Name  08/06/21 1444  ?  ?  ?  ?  ?  ? Exercise Goals  ? Increase Physical Activity Yes      ? Intervention Provide advice, education, support and counseling about physical activity/exercise needs.;Develop an individualized exercise prescription for aerobic and resistive training based on initial evaluation findings, risk stratification, comorbidities and participant's personal goals.      ? Expected Outcomes Short Term: Attend rehab on a regular basis to increase amount of physical activity.;Long Term: Add in home exercise to make exercise part of routine and to increase amount of physical activity.;Long Term: Exercising regularly at least 3-5 days a week.      ? Increase Strength and Stamina Yes      ? Intervention Provide advice, education, support and counseling about physical activity/exercise needs.;Develop an individualized exercise prescription  for aerobic and resistive training based on initial evaluation findings, risk stratification, comorbidities and participant's personal goals.      ? Expected Outcomes Short Term: Increase workloads from in

## 2021-08-06 NOTE — Progress Notes (Signed)
Cardiac Individual Treatment Plan ? ?Patient Details  ?Name: Adam Hunter ?MRN: [id number] ?Date of Birth: 06-09-1960 ?Referring Provider:   ?Flowsheet Row Cardiac Rehab from 08/06/2021 in [facility] Cardiac and Pulmonary Rehab  ?Referring Provider Dr. Royann Shivers  ? ?  ? ? ?Initial Encounter Date:  ?Flowsheet Row Cardiac Rehab from 08/06/2021 in [facility] Cardiac and Pulmonary Rehab  ?Date 08/06/21  ? ?  ? ? ?Visit Diagnosis: Status post coronary artery stent placement ? ?Patient's Home Medications on Admission: ? ?Current Outpatient Medications:  ?  Ascorbic Acid (VITAMIN C) 1000 MG tablet, Take 1,000 mg by mouth daily., Disp: , Rfl:  ?  aspirin EC 81 MG tablet, Take 1 tablet (81 mg total) by mouth daily., Disp: 90 tablet, Rfl: 3 ?  atorvastatin (LIPITOR) 40 MG tablet, Take 1 tablet (40 mg total) by mouth daily., Disp: 30 tablet, Rfl: 6 ?  Cholecalciferol (VITAMIN D) 50 MCG (2000 UT) tablet, Take 2,000 Units by mouth daily., Disp: , Rfl:  ?  clopidogrel (PLAVIX) 75 MG tablet, TAKE 1 TABLET BY MOUTH DAILY, Disp: 90 tablet, Rfl: 3 ?  dapagliflozin propanediol (FARXIGA) 10 MG TABS tablet, Take 1 tablet (10 mg total) by mouth daily before breakfast., Disp: 90 tablet, Rfl: 1 ?  hydrochlorothiazide (HYDRODIURIL) 25 MG tablet, TAKE 1 TABLET BY MOUTH DAILY, Disp: 90 tablet, Rfl: 0 ?  metFORMIN (GLUCOPHAGE) 500 MG tablet, Take 1 tablet (500 mg total) by mouth 2 (two) times daily., Disp: 180 tablet, Rfl: 1 ?  metoprolol succinate (TOPROL-XL) 100 MG 24 hr tablet, TAKE 1 TABLET BY MOUTH DAILY WITH OR IMMEDIATELY FOLLOWING A MEAL, Disp: 90 tablet, Rfl: 2 ?  nitroGLYCERIN (NITROSTAT) 0.4 MG SL tablet, Place 1 tablet (0.4 mg total) under the tongue every 5 (five) minutes as needed., Disp: 25 tablet, Rfl: 12 ?  olmesartan (BENICAR) 40 MG tablet, Take 1 tablet (40 mg total) by mouth daily., Disp: 90 tablet, Rfl: 1 ?  pantoprazole (PROTONIX) 40 MG tablet, Take 1 tablet (40 mg total) by mouth daily. Further refills by PCP., Disp: 30 tablet, Rfl: 1 ?   sildenafil (VIAGRA) 50 MG tablet, Take 1 tablet (50 mg total) by mouth daily as needed for erectile dysfunction., Disp: 10 tablet, Rfl: 3 ?  Zinc 50 MG TABS, Take 50 mg by mouth 2 (two) times a week. Wednesdays and Sundays, Disp: , Rfl:  ? ?Current Facility-Administered Medications:  ?  sodium chloride flush (NS) 0.9 % injection 3 mL, 3 mL, Intravenous, Q12H, Croitoru, Mihai, MD ? ?Past Medical History: ?Past Medical History:  ?Diagnosis Date  ? CAD (coronary artery disease) 11/25/2012  ? STEMI October 2011 secondary to occlusion of a large septal branch of the LAD artery treated medically  ? Emphysema   ? Heart attack (HCC) 01/2010  ? non-STEMI  ? Hyperlipidemia   ? Hyperlipidemia 11/25/2012  ? Hypertension   ? OSA on CPAP   ? ? ?Tobacco Use: ?Social History  ? ?Tobacco Use  ?Smoking Status Former  ? Packs/day: 1.50  ? Years: 30.00  ? Pack years: 45.00  ? Types: Cigarettes  ? Quit date: 01/22/2010  ? Years since quitting: 11.5  ?Smokeless Tobacco Former  ? ? ?Labs: ?Review Flowsheet   ? ?  ?  Latest Ref Rng & Units 04/22/2017 08/03/2019 09/17/2019 08/16/2020  ?Labs for ITP Cardiac and Pulmonary Rehab  ?Cholestrol 100 - 199 mg/dL 119   147    829    ?LDL (calc) 0 - 99 mg/dL 75   65  55    ?HDL-C >39 mg/dL 27   24    27     ?Trlycerides 0 - 149 mg/dL   938    101    ?Hemoglobin A1c 4.8 - 5.6 %   10.6     ? ?  07/05/2021  ?Labs for [facility]P Cardiac and Pulmonary Rehab  ?Cholestrol 117    ?LDL (calc) 60    ?HDL-C 26    ?Trlycerides 186    ?Hemoglobin A1c 6.8    ?  ? ? Multiple values from one day are sorted in reverse-chronological order  ?  ?  ? ? ? ?Exercise Target Goals: ?Exercise Program Goal: ?Individual exercise prescription set using results from initial 6 min walk test and THRR while considering  patient?s activity barriers and safety.  ? ?Exercise Prescription Goal: ?Initial exercise prescription builds to 30-45 minutes a day of aerobic activity, 2-3 days per week.  Home exercise guidelines will be given to patient  during program as part of exercise prescription that the participant will acknowledge. ? ? ?Education: Aerobic Exercise: ?- Group verbal and visual presentation on the components of exercise prescription. Introduces F.I.T.T principle from ACSM for exercise prescriptions.  Reviews F.I.T.T. principles of aerobic exercise including progression. Written material given at graduation. ? ? ?Education: Resistance Exercise: ?- Group verbal and visual presentation on the components of exercise prescription. Introduces F.I.T.T principle from ACSM for exercise prescriptions  Reviews F.I.T.T. principles of resistance exercise including progression. Written material given at graduation. ? ?  ?Education: Exercise & Equipment Safety: ?- Individual verbal instruction and demonstration of equipment use and safety with use of the equipment. ?Flowsheet Row Cardiac Rehab from 08/06/2021 in [facility] Cardiac and Pulmonary Rehab  ?Date 08/06/21  ?Educator KH  ?Instruction Review Code 1- Verbalizes Understanding  ? ?  ? ? ?Education: Exercise Physiology & General Exercise Guidelines: ?- Group verbal and written instruction with models to review the exercise physiology of the cardiovascular system and associated critical values. Provides general exercise guidelines with specific guidelines to those with heart or lung disease.  ? ? ?Education: Flexibility, Balance, Mind/Body Relaxation: ?- Group verbal and visual presentation with interactive activity on the components of exercise prescription. Introduces F.I.T.T principle from ACSM for exercise prescriptions. Reviews F.I.T.T. principles of flexibility and balance exercise training including progression. Also discusses the mind body connection.  Reviews various relaxation techniques to help reduce and manage stress (i.e. Deep breathing, progressive muscle relaxation, and visualization). Balance handout provided to take home. Written material given at graduation. ? ? ?Activity Barriers & Risk  Stratification: ? Activity Barriers & Cardiac Risk Stratification - 07/27/21 1012   ? ?  ? Activity Barriers & Cardiac Risk Stratification  ? Activity Barriers None   ? Cardiac Risk Stratification Moderate   ? ?  ?  ? ?  ? ? ?6 Minute Walk: ? 6 Minute Walk   ? ? Row Name 08/06/21 1438  ?  ?  ?  ? 6 Minute Walk  ? Phase Initial    ? Distance 1435 feet    ? Walk Time 6 minutes    ? # of Rest Breaks 0    ? MPH 2.72    ? METS 3.46    ? RPE 10    ? Perceived Dyspnea  0    ? VO2 Peak 12.12    ? Symptoms No    ? Resting HR 85 bpm    ? Resting BP 122/82    ? Resting  Oxygen Saturation  96 %    ? Exercise Oxygen Saturation  during 6 min walk 96 %    ? Max Ex. HR 109 bpm    ? Max Ex. BP 142/80    ? 2 Minute Post BP 114/72    ? ?  ?  ? ?  ? ? ?Oxygen Initial Assessment: ? ? ?Oxygen Re-Evaluation: ? ? ?Oxygen Discharge (Final Oxygen Re-Evaluation): ? ? ?Initial Exercise Prescription: ? Initial Exercise Prescription - 08/06/21 1400   ? ?  ? Date of Initial Exercise RX and Referring Provider  ? Date 08/06/21   ? Referring Provider Dr. Royann Shiversroitoru   ?  ? Oxygen  ? Maintain Oxygen Saturation 88% or higher   ?  ? Treadmill  ? MPH 3   ? Grade 1   ? Minutes 15   ? METs 3.71   ?  ? Recumbant Bike  ? Level 3   ? RPM 60   ? Minutes 15   ? METs 3.46   ?  ? NuStep  ? Level 4   ? SPM 80   ? Minutes 15   ? METs 3.46   ?  ? Elliptical  ? Level 1   ? Speed 3   ? Minutes 15   ? METs 3.46   ?  ? REL-XR  ? Level 3   ? Speed 50   ? Minutes 15   ? METs 3.46   ?  ? Biostep-RELP  ? Level 3   ? SPM 50   ? Minutes 15   ? METs 3.46   ?  ? Prescription Details  ? Frequency (times per week) 3   ? Duration Progress to 30 minutes of continuous aerobic without signs/symptoms of physical distress   ?  ? Intensity  ? THRR 40-80% of Max Heartrate 115-145   ? Ratings of Perceived Exertion 11-13   ? Perceived Dyspnea 0-4   ?  ? Progression  ? Progression Continue to progress workloads to maintain intensity without signs/symptoms of physical distress.   ?  ?  Resistance Training  ? Training Prescription Yes   ? Weight 4   ? Reps 10-15   ? ?  ?  ? ?  ? ? ?Perform Capillary Blood Glucose checks as needed. ? ?Exercise Prescription Changes: ? ? Exercise Prescription Changes

## 2021-08-08 DIAGNOSIS — Z955 Presence of coronary angioplasty implant and graft: Secondary | ICD-10-CM

## 2021-08-08 LAB — GLUCOSE, CAPILLARY
Glucose-Capillary: 149 mg/dL — ABNORMAL HIGH (ref 70–99)
Glucose-Capillary: 115 mg/dL — ABNORMAL HIGH (ref 70–99)

## 2021-08-08 NOTE — Progress Notes (Signed)
Daily Session Note ? ?Patient Details  ?Name: Adam Hunter ?MRN: [id number] ?Date of Birth: 04/18/1960 ?Referring Provider:   ?Flowsheet Row Cardiac Rehab from 08/06/2021 in [facility] Cardiac and Pulmonary Rehab  ?Referring Provider Dr. Croitoru  ? ?  ? ? ?Encounter Date: 08/08/2021 ? ?Check In: ? Session Check In - 08/08/21 1337   ? ?  ? Check-In  ? Supervising physician immediately available to respond to emergencies See telemetry face sheet for immediately available ER MD   ? Location [facility]-Cardiac & Pulmonary Rehab   ? Staff Present Kelly Bollinger, MPA, RN;Joseph [place], RCP,RRT,BSRT;Melissa Cai[place], RDN, LDN   ? Virtual Visit No   ? Medication changes reported     No   ? Fall or balance concerns reported    No   ? Warm-up and Cool-down Performed on first and last piece of equipment   ? Resistance Training Performed Yes   ? VAD Patient? No   ? PAD/SET Patient? No   ?  ? Pain Assessment  ? Currently in Pain? No/denies   ? ?  ?  ? ?  ? ? ? ? ? ?Social History  ? ?Tobacco Use  ?Smoking Status Former  ? Packs/day: 1.50  ? Years: 30.00  ? Pack years: 45.00  ? Types: Cigarettes  ? Quit date: 01/22/2010  ? Years since quitting: 11.5  ?Smokeless Tobacco Former  ? ? ?Goals Met:  ?Independence with exercise equipment ?Exercise tolerated well ?No report of concerns or symptoms today ?Strength training completed today ? ?Goals Unmet:  ?Not Applicable ? ?Comments: First full day of exercise!  Patient was oriented to gym and equipment including functions, settings, policies, and procedures.  Patient's individual exercise prescription and treatment plan were reviewed.  All starting workloads were established based on the results of the 6 minute walk test done at initial orientation visit.  The plan for exercise progression was also introduced and progression will be customized based on patient's performance and goals. ? ? ? ? ?Dr. Mark Miller is Medical Director for [facility].  ?Dr. Fuad Aleskerov is Medical  Director for [facility] Pulmonary Rehabilitation. ?

## 2021-08-09 ENCOUNTER — Other Ambulatory Visit: Payer: Self-pay | Admitting: Cardiovascular Disease

## 2021-08-09 DIAGNOSIS — Z955 Presence of coronary angioplasty implant and graft: Secondary | ICD-10-CM

## 2021-08-09 LAB — GLUCOSE, CAPILLARY: Glucose-Capillary: 176 mg/dL — ABNORMAL HIGH (ref 70–99)

## 2021-08-09 NOTE — Progress Notes (Signed)
Daily Session Note ? ?Patient Details  ?Name: Adam Hunter ?MRN: [id number] ?Date of Birth: 05/03/1960 ?Referring Provider:   ?Flowsheet Row Cardiac Rehab from 08/06/2021 in [facility] Cardiac and Pulmonary Rehab  ?Referring Provider Dr. Sallyanne Kuster  ? ?  ? ? ?Encounter Date: 08/09/2021 ? ?Check In: ? Session Check In - 08/09/21 1336   ? ?  ? Check-In  ? Supervising physician immediately available to respond to emergencies See telemetry face sheet for immediately available ER MD   ? Location [facility]-Cardiac & Pulmonary Rehab   ? Staff Present Alberteen Sam, MA, RCEP, CCRP, CCET;Joseph [place], [place];Vida Rigger, RN, BSN   ? Virtual Visit No   ? Medication changes reported     No   ? Fall or balance concerns reported    No   ? Warm-up and Cool-down Performed on first and last piece of equipment   ? Resistance Training Performed Yes   ? VAD Patient? No   ? PAD/SET Patient? No   ?  ? Pain Assessment  ? Currently in Pain? No/denies   ? ?  ?  ? ?  ? ? ? ? ? ?Social History  ? ?Tobacco Use  ?Smoking Status Former  ? Packs/day: 1.50  ? Years: 30.00  ? Pack years: 45.00  ? Types: Cigarettes  ? Quit date: 01/22/2010  ? Years since quitting: 11.5  ?Smokeless Tobacco Former  ? ? ?Goals Met:  ?Independence with exercise equipment ?Exercise tolerated well ?No report of concerns or symptoms today ?Strength training completed today ? ?Goals Unmet:  ?Not Applicable ? ?Comments: Pt able to follow exercise prescription today without complaint.  Will continue to monitor for progression. ? ? ?Dr. Emily Filbert is Medical Director for [place].  ?Dr. Ottie Glazier is Medical Director for [facility] Pulmonary Rehabilitation. ?

## 2021-08-13 ENCOUNTER — Encounter: Payer: BC Managed Care – PPO | Attending: Cardiovascular Disease

## 2021-08-13 ENCOUNTER — Other Ambulatory Visit: Payer: Self-pay | Admitting: Cardiovascular Disease

## 2021-08-13 DIAGNOSIS — Z955 Presence of coronary angioplasty implant and graft: Secondary | ICD-10-CM | POA: Insufficient documentation

## 2021-08-13 DIAGNOSIS — Z48812 Encounter for surgical aftercare following surgery on the circulatory system: Secondary | ICD-10-CM | POA: Insufficient documentation

## 2021-08-13 LAB — GLUCOSE, CAPILLARY: Glucose-Capillary: 122 mg/dL — ABNORMAL HIGH (ref 70–99)

## 2021-08-13 NOTE — Progress Notes (Signed)
Daily Session Note ? ?Patient Details  ?Name: Adam Hunter ?MRN: [id number] ?Date of Birth: 1960-07-16 ?Referring Provider:   ?Flowsheet Row Cardiac Rehab from 08/06/2021 in [facility] Cardiac and Pulmonary Rehab  ?Referring Provider Dr. Sallyanne Kuster  ? ?  ? ? ?Encounter Date: 08/13/2021 ? ?Check In: ? Session Check In - 08/13/21 1327   ? ?  ? Check-In  ? Supervising physician immediately available to respond to emergencies See telemetry face sheet for immediately available ER MD   ? Location [facility]-Cardiac & Pulmonary Rehab   ? Staff Present Birdie Sons, MPA, RN;Joseph [place], RCP,RRT,BSRT;Kara [place], MS, ASCM CEP, Exercise Physiologist   ? Virtual Visit No   ? Medication changes reported     No   ? Fall or balance concerns reported    No   ? Warm-up and Cool-down Performed on first and last piece of equipment   ? Resistance Training Performed Yes   ? VAD Patient? No   ? PAD/SET Patient? No   ?  ? Pain Assessment  ? Currently in Pain? No/denies   ? ?  ?  ? ?  ? ? ? ? ? ?Social History  ? ?Tobacco Use  ?Smoking Status Former  ? Packs/day: 1.50  ? Years: 30.00  ? Pack years: 45.00  ? Types: Cigarettes  ? Quit date: 01/22/2010  ? Years since quitting: 11.5  ?Smokeless Tobacco Former  ? ? ?Goals Met:  ?Independence with exercise equipment ?Exercise tolerated well ?No report of concerns or symptoms today ?Strength training completed today ? ?Goals Unmet:  ?Not Applicable ? ?Comments: Pt able to follow exercise prescription today without complaint.  Will continue to monitor for progression. ? ? ? ?Dr. Emily Filbert is Medical Director for [place].  ?Dr. Ottie Glazier is Medical Director for [facility] Pulmonary Rehabilitation. ?

## 2021-08-15 DIAGNOSIS — Z955 Presence of coronary angioplasty implant and graft: Secondary | ICD-10-CM

## 2021-08-15 DIAGNOSIS — Z48812 Encounter for surgical aftercare following surgery on the circulatory system: Secondary | ICD-10-CM | POA: Diagnosis not present

## 2021-08-15 NOTE — Progress Notes (Signed)
Daily Session Note ? ?Patient Details  ?Name: Adam Hunter ?MRN: [id number] ?Date of Birth: 08-12-1960 ?Referring Provider:   ?Flowsheet Row Cardiac Rehab from 08/06/2021 in [facility] Cardiac and Pulmonary Rehab  ?Referring Provider Dr. Sallyanne Kuster  ? ?  ? ? ?Encounter Date: 08/15/2021 ? ?Check In: ? Session Check In - 08/15/21 1330   ? ?  ? Check-In  ? Supervising physician immediately available to respond to emergencies See telemetry face sheet for immediately available ER MD   ? Location [facility]-Cardiac & Pulmonary Rehab   ? Staff Present Birdie Sons, MPA, RN;Melissa [place], RDN, LDN;Joseph [place], RCP,RRT,BSRT   ? Virtual Visit No   ? Medication changes reported     No   ? Fall or balance concerns reported    No   ? Warm-up and Cool-down Performed on first and last piece of equipment   ? Resistance Training Performed Yes   ? VAD Patient? No   ? PAD/SET Patient? No   ?  ? Pain Assessment  ? Currently in Pain? No/denies   ? ?  ?  ? ?  ? ? ? ? ? ?Social History  ? ?Tobacco Use  ?Smoking Status Former  ? Packs/day: 1.50  ? Years: 30.00  ? Pack years: 45.00  ? Types: Cigarettes  ? Quit date: 01/22/2010  ? Years since quitting: 11.5  ?Smokeless Tobacco Former  ? ? ?Goals Met:  ?Independence with exercise equipment ?Exercise tolerated well ?No report of concerns or symptoms today ?Strength training completed today ? ?Goals Unmet:  ?Not Applicable ? ?Comments: Pt able to follow exercise prescription today without complaint.  Will continue to monitor for progression. ? ? ? ?Dr. Emily Filbert is Medical Director for [place].  ?Dr. Ottie Glazier is Medical Director for [facility] Pulmonary Rehabilitation. ?

## 2021-08-16 ENCOUNTER — Encounter: Payer: BC Managed Care – PPO | Admitting: *Deleted

## 2021-08-16 DIAGNOSIS — Z955 Presence of coronary angioplasty implant and graft: Secondary | ICD-10-CM

## 2021-08-16 DIAGNOSIS — Z48812 Encounter for surgical aftercare following surgery on the circulatory system: Secondary | ICD-10-CM | POA: Diagnosis not present

## 2021-08-16 NOTE — Progress Notes (Signed)
Daily Session Note ? ?Patient Details  ?Name: Adam Hunter ?MRN: [id number] ?Date of Birth: 05-27-1960 ?Referring Provider:   ?Flowsheet Row Cardiac Rehab from 08/06/2021 in [facility] Cardiac and Pulmonary Rehab  ?Referring Provider Dr. Sallyanne Kuster  ? ?  ? ? ?Encounter Date: 08/16/2021 ? ?Check In: ? Session Check In - 08/16/21 1351   ? ?  ? Check-In  ? Supervising physician immediately available to respond to emergencies See telemetry face sheet for immediately available ER MD   ? Location [facility]-Cardiac & Pulmonary Rehab   ? Staff Present Heath Lark, RN, BSN, CCRP;Melissa [place], RDN, LDN;Joseph [place], [place]   ? Virtual Visit No   ? Medication changes reported     No   ? Fall or balance concerns reported    No   ? Warm-up and Cool-down Performed on first and last piece of equipment   ? Resistance Training Performed Yes   ? VAD Patient? No   ? PAD/SET Patient? No   ?  ? Pain Assessment  ? Currently in Pain? No/denies   ? ?  ?  ? ?  ? ? ? ? Exercise Prescription Changes - 08/16/21 1300   ? ?  ? Home Exercise Plan  ? Plans to continue exercise at Home (comment)   walking  ? Frequency Add 2 additional days to program exercise sessions.   ? Initial Home Exercises Provided 08/16/21   ? ?  ?  ? ?  ? ? ?Social History  ? ?Tobacco Use  ?Smoking Status Former  ? Packs/day: 1.50  ? Years: 30.00  ? Pack years: 45.00  ? Types: Cigarettes  ? Quit date: 01/22/2010  ? Years since quitting: 11.5  ?Smokeless Tobacco Former  ? ? ?Goals Met:  ?Independence with exercise equipment ?Exercise tolerated well ?No report of concerns or symptoms today ? ?Goals Unmet:  ?Not Applicable ? ?Comments: Pt able to follow exercise prescription today without complaint.  Will continue to monitor for progression. ? ? ? ?Dr. Emily Filbert is Medical Director for [place].  ?Dr. Ottie Glazier is Medical Director for [facility] Pulmonary Rehabilitation. ?

## 2021-08-20 DIAGNOSIS — Z48812 Encounter for surgical aftercare following surgery on the circulatory system: Secondary | ICD-10-CM | POA: Diagnosis not present

## 2021-08-20 DIAGNOSIS — Z955 Presence of coronary angioplasty implant and graft: Secondary | ICD-10-CM | POA: Diagnosis not present

## 2021-08-20 NOTE — Progress Notes (Signed)
Daily Session Note ? ?Patient Details  ?Name: Adam Hunter ?MRN: [id number] ?Date of Birth: 1960/08/10 ?Referring Provider:   ?Flowsheet Row Cardiac Rehab from 08/06/2021 in [facility] Cardiac and Pulmonary Rehab  ?Referring Provider Dr. Sallyanne Kuster  ? ?  ? ? ?Encounter Date: 08/20/2021 ? ?Check In: ? Session Check In - 08/20/21 1345   ? ?  ? Check-In  ? Supervising physician immediately available to respond to emergencies See telemetry face sheet for immediately available ER MD   ? Location [facility]-Cardiac & Pulmonary Rehab   ? Staff Present Birdie Sons, MPA, RN;Melissa [place], RDN, LDN;Jessica Mullens, MA, RCEP, CCRP, Marylynn Pearson, MS, ASCM CEP, Exercise Physiologist   ? Virtual Visit No   ? Medication changes reported     No   ? Fall or balance concerns reported    No   ? Warm-up and Cool-down Performed on first and last piece of equipment   ? Resistance Training Performed Yes   ? VAD Patient? No   ? PAD/SET Patient? No   ?  ? Pain Assessment  ? Currently in Pain? No/denies   ? ?  ?  ? ?  ? ? ? ? ? ?Social History  ? ?Tobacco Use  ?Smoking Status Former  ? Packs/day: 1.50  ? Years: 30.00  ? Pack years: 45.00  ? Types: Cigarettes  ? Quit date: 01/22/2010  ? Years since quitting: 11.5  ?Smokeless Tobacco Former  ? ? ?Goals Met:  ?Independence with exercise equipment ?Exercise tolerated well ?No report of concerns or symptoms today ?Strength training completed today ? ?Goals Unmet:  ?Not Applicable ? ?Comments: Pt able to follow exercise prescription today without complaint.  Will continue to monitor for progression. ? ? ? ?Dr. Emily Filbert is Medical Director for [place].  ?Dr. Ottie Glazier is Medical Director for [facility] Pulmonary Rehabilitation. ?

## 2021-08-21 ENCOUNTER — Ambulatory Visit (INDEPENDENT_AMBULATORY_CARE_PROVIDER_SITE_OTHER): Payer: BC Managed Care – PPO | Admitting: Cardiovascular Disease

## 2021-08-21 ENCOUNTER — Encounter: Payer: Self-pay | Admitting: Cardiovascular Disease

## 2021-08-21 VITALS — BP 118/80 | HR 69 | Ht 72.0 in | Wt 249.5 lb

## 2021-08-21 DIAGNOSIS — E785 Hyperlipidemia, unspecified: Secondary | ICD-10-CM | POA: Diagnosis not present

## 2021-08-21 DIAGNOSIS — I5042 Chronic combined systolic (congestive) and diastolic (congestive) heart failure: Secondary | ICD-10-CM | POA: Diagnosis not present

## 2021-08-21 DIAGNOSIS — G4733 Obstructive sleep apnea (adult) (pediatric): Secondary | ICD-10-CM

## 2021-08-21 DIAGNOSIS — I10 Essential (primary) hypertension: Secondary | ICD-10-CM | POA: Diagnosis not present

## 2021-08-21 DIAGNOSIS — I2510 Atherosclerotic heart disease of native coronary artery without angina pectoris: Secondary | ICD-10-CM | POA: Diagnosis not present

## 2021-08-21 DIAGNOSIS — E119 Type 2 diabetes mellitus without complications: Secondary | ICD-10-CM

## 2021-08-21 MED ORDER — OLMESARTAN MEDOXOMIL 20 MG PO TABS: 20.0000 mg | ORAL_TABLET | Freq: Every day | ORAL | 3 refills | Status: DC

## 2021-08-21 MED ORDER — ATORVASTATIN CALCIUM 80 MG PO TABS: 80.0000 mg | ORAL_TABLET | Freq: Every day | ORAL | 3 refills | Status: DC

## 2021-08-21 NOTE — Patient Instructions (Signed)
Medication Instructions:  ?DECREASE the Olmesartan to 20 mg once daily ? ?INCREASE the Atorvastatin 80 mg once daily ? ?*If you need a refill on your cardiac medications before your next appointment, please call your pharmacy* ? ? ?Lab Work: ?None ordered ?If you have labs (blood work) drawn today and your tests are completely normal, you will receive your results only by: ?MyChart Message (if you have MyChart) OR ?A paper copy in the mail ?If you have any lab test that is abnormal or we need to change your treatment, we will call you to review the results. ? ? ?Testing/Procedures: ?None ordered ? ? ?Follow-Up: ?At [facility], you and your health needs are our priority.  As part of our continuing mission to provide you with exceptional heart care, we have created designated Provider Care Teams.  These Care Teams include your primary Cardiologist (physician) and Advanced Practice Providers (APPs -  Physician Assistants and Nurse Practitioners) who all work together to provide you with the care you need, when you need it. ? ?We recommend signing up for the patient portal called "MyChart".  Sign up information is provided on this After Visit Summary.  MyChart is used to connect with patients for Virtual Visits (Telemedicine).  Patients are able to view lab/test results, encounter notes, upcoming appointments, etc.  Non-urgent messages can be sent to your provider as well.   ?To learn more about what you can do with MyChart, go to [email or web address].   ? ?Your next appointment:   ?8 month(s) ? ?The format for your next appointment:   ?In Person ? ?Provider:   ?Thurmon Fair, MD { ? ? ?Important Information About Sugar ? ? ? ? ? ? ?

## 2021-08-21 NOTE — Progress Notes (Signed)
? ?Cardiology office note  ? ?Evaluation Performed:  Follow-up visit ? ?Date:  08/26/2021  ? ?ID:  Adam Hunter, DOB 03-31-1961, MRN [id number] ? ?PCP:  Leonard Downing, MD  ?Cardiologist:  Ofelia Podolski ?Electrophysiologist:  None  ? ?Chief Complaint:  CAD f/u ? ?History of Present Illness:   ? ?Vester has a history of acute ST segment elevation myocardial infarction October 2011 due to occlusion of a large septal branch of the LAD artery, treated medically, returning with progressive angina in March 2023 and found to have a 99% proximal LAD stenosis with TIMI II flow, treated placement of a drug-eluting stent (Synergy 3.0 x 48 mm) the first diagonal had a 50% stenosis, whilst his other coronaries appeared ectatic but did not have any other meaningful stenoses.  Note was made of mild-moderate anterior wall hypokinesis with EF 50% but LVEDP was normal at 10 mmHg.  Additional comorbidities include hypertension, dyslipidemia with severely decreased HDL, obstructive sleep apnea on CPAP and morbid obesity (he gained a lot of weight after stopping smoking). ? ?He feels much better after the stent procedure.  He no longer has chest pressure (this was described as retrosternal and radiating through to his back between his shoulder blades, different from his original presentation in 2011 when he had left shoulder pain). ? ?The patient specifically denies any chest pain at rest exertion, dyspnea at rest or with exertion, orthopnea, paroxysmal nocturnal dyspnea, syncope, palpitations, focal neurological deficits, intermittent claudication, lower extremity edema, unexplained weight gain, cough, hemoptysis or wheezing. ? ?He is compliant with CPAP and denies daytime hypersomnolence.  He is on aspirin and clopidogrel and understands he is to take these without interruption for minimum of 6 months, preferably 12 months due to the proximal LAD location of the stent.  He is now on atorvastatin 80 mg daily. ? ?Past Medical History:   ?Diagnosis Date  ? CAD (coronary artery disease) 11/25/2012  ? STEMI October 2011 secondary to occlusion of a large septal branch of the LAD artery treated medically  ? Emphysema   ? Heart attack ([place]) 01/2010  ? non-STEMI  ? Hyperlipidemia   ? Hyperlipidemia 11/25/2012  ? Hypertension   ? OSA on CPAP   ? ?Past Surgical History:  ?Procedure Laterality Date  ? CARDIAC CATHETERIZATION  01/20/2010  ? Diffuse coronary atherosclerosis w/coronary ectasia,totalled first septal perforator. non-obstructive LAD/diagonal,left CX and RCA stenosis  ? CORONARY ATHERECTOMY N/A 07/19/2021  ? Procedure: CORONARY ATHERECTOMY;  Surgeon: Belva Crome, MD;  Location: [facility];  Service: Cardiovascular;  Laterality: N/A;  ? CORONARY STENT INTERVENTION N/A 07/19/2021  ? Procedure: CORONARY STENT INTERVENTION;  Surgeon: Belva Crome, MD;  Location: [facility];  Service: Cardiovascular;  Laterality: N/A;  ? [place]  ? INTRAVASCULAR ULTRASOUND/IVUS N/A 07/19/2021  ? Procedure: Intravascular Ultrasound/IVUS;  Surgeon: Belva Crome, MD;  Location: [facility];  Service: Cardiovascular;  Laterality: N/A;  ? LEFT HEART CATH AND CORONARY ANGIOGRAPHY N/A 07/19/2021  ? Procedure: LEFT HEART CATH AND CORONARY ANGIOGRAPHY;  Surgeon: Belva Crome, MD;  Location: [facility];  Service: Cardiovascular;  Laterality: N/A;  ? NM MYOCAR PERF WALL MOTION  05/22/2010  ? normal,EF 53%.  ? US ECHOCARDIOGRAPHY  01/20/2010  ? mod. LVH,grade I diastolic dysfunction,mild AI,MR,MV annular ca+  ?  ? ?Current Meds  ?Medication Sig  ? Ascorbic Acid (VITAMIN C) 1000 MG tablet Take 1,000 mg by mouth daily.  ? aspirin EC 81  MG tablet Take 1 tablet (81 mg total) by mouth daily.  ? Cholecalciferol (VITAMIN D) 50 MCG (2000 UT) tablet Take 2,000 Units by mouth daily.  ? clopidogrel (PLAVIX) 75 MG tablet TAKE 1 TABLET BY MOUTH DAILY  ? dapagliflozin propanediol (FARXIGA) 10 MG TABS tablet Take 1 tablet (10 mg total) by mouth daily before  breakfast.  ? hydrochlorothiazide (HYDRODIURIL) 25 MG tablet TAKE 1 TABLET BY MOUTH DAILY  ? metFORMIN (GLUCOPHAGE) 500 MG tablet Take 1 tablet (500 mg total) by mouth 2 (two) times daily.  ? metoprolol succinate (TOPROL-XL) 100 MG 24 hr tablet TAKE 1 TABLET BY MOUTH DAILY WITH OR IMMEDIATELY FOLLOWING A MEAL  ? nitroGLYCERIN (NITROSTAT) 0.4 MG SL tablet Place 1 tablet (0.4 mg total) under the tongue every 5 (five) minutes as needed.  ? pantoprazole (PROTONIX) 40 MG tablet Take 1 tablet (40 mg total) by mouth daily. Further refills by PCP.  ? sildenafil (VIAGRA) 50 MG tablet Take 1 tablet (50 mg total) by mouth daily as needed for erectile dysfunction.  ? Zinc 50 MG TABS Take 50 mg by mouth 2 (two) times a week. Wednesdays and Sundays  ? [DISCONTINUED] atorvastatin (LIPITOR) 40 MG tablet Take 1 tablet (40 mg total) by mouth daily.  ? [DISCONTINUED] olmesartan (BENICAR) 40 MG tablet Take 1 tablet (40 mg total) by mouth daily.  ? ?Current Facility-Administered Medications for the 08/21/21 encounter (Office Visit) with Deshante Cassell, Dani Gobble, MD  ?Medication  ? sodium chloride flush (NS) 0.9 % injection 3 mL  ?  ? ?Allergies:   Patient has no known allergies.  ? ?Social History  ? ?Tobacco Use  ? Smoking status: Former  ?  Packs/day: 1.50  ?  Years: 30.00  ?  Pack years: 45.00  ?  Types: Cigarettes  ?  Quit date: 01/22/2010  ?  Years since quitting: 11.6  ? Smokeless tobacco: Former  ?Vaping Use  ? Vaping Use: Never used  ?Substance Use Topics  ? Alcohol use: Yes  ?  Comment: social  ? Drug use: No  ?  ? ?Family Hx: ?The patient's family history includes Cancer in his maternal grandfather; Heart attack in his father; Heart disease in his father; Hypertension in his father and mother. ? ?ROS:   ?Please see the history of present illness.    ?All other systems are reviewed and are negative. ? ?Prior CV studies:   ?The following studies were reviewed today: ? ?Echocardiogram 07/04/2021 ? 1. Left ventricular ejection fraction, by  estimation, is 40 to 45%. Left ventricular ejection fraction by 2D MOD biplane is 42.8 %. The left ventricle has mildly decreased function. The left ventricle demonstrates global hypokinesis. There is mild left ventricular hypertrophy. Left ventricular diastolic parameters are consistent with Grade I diastolic dysfunction (impaired relaxation).  ? 2. Right ventricular systolic function is normal. The right ventricular size is mildly enlarged.  ? 3. Right atrial size was mildly dilated.  ? 4. The mitral valve is grossly normal. Trivial mitral valve  ?regurgitation.  ? 5. The aortic valve is tricuspid. Aortic valve regurgitation is not visualized.  ? 6. Aortic dilatation noted. There is mild dilatation of the aortic root, measuring 44 mm. There is borderline dilatation of the ascending aorta, measuring 38 mm.  ? ?Comparison(s): Prior images unable to be directly viewed, comparison made by report only. Changes from prior study are noted. 01/20/2010: LVEF 50-55%, mild MR. ? ?Labs/Other Tests and Data Reviewed:   ? ?Cardiac catheterization 07/19/2021  ? ?Diagnostic ?Dominance:  Right ?Intervention ? ?Implants    ? ?Permanent Stent  ?Synergy Xd 3.0x48 ?  ? ? ?Proximal and mid 99% LAD stenoses within a calcified segment and resultant TIMI grade II flow.  Following orbital atherectomy of both lesions, followed by balloon expansion using a Score flex scoring balloon, a Synergy 3.0 x 48 mm drug-eluting stent was deployed at 14 atm.  The proximal margin of the stent was postdilated at high pressure with a 3.5 mm balloon.  0% stenosis and TIMI grade III flow was noted. ?Coronaries otherwise ectatic but without obstructive disease.  The large first diagonal contains 50% obstruction. ?Mild to moderate hypokinesis is noted in the mid anterior wall.  EF 50% ?LVEDP 10 mmHg ?  ?RECOMMENDATIONS: ?  ?Aggressive risk factor modification.  High intensity statin therapy. ?Because of the duration of the procedure and contrast used, patient  will spend the night and be discharged in a.m. ? ?EKG: ECG he has not ordered today.  ECG from 07/23/2021 shows sinus rhythm and old right bundle branch block, QTc 455 ms ? ?Recent Labs: ?07/20/2021: BUN 16;

## 2021-08-22 DIAGNOSIS — Z955 Presence of coronary angioplasty implant and graft: Secondary | ICD-10-CM

## 2021-08-22 DIAGNOSIS — Z48812 Encounter for surgical aftercare following surgery on the circulatory system: Secondary | ICD-10-CM | POA: Diagnosis not present

## 2021-08-22 NOTE — Progress Notes (Signed)
Daily Session Note ? ?Patient Details  ?Name: Adam Hunter ?MRN: [id number] ?Date of Birth: 05/11/1960 ?Referring Provider:   ?Flowsheet Row Cardiac Rehab from 08/06/2021 in [facility] Cardiac and Pulmonary Rehab  ?Referring Provider Dr. Sallyanne Kuster  ? ?  ? ? ?Encounter Date: 08/22/2021 ? ?Check In: ? Session Check In - 08/22/21 1334   ? ?  ? Check-In  ? Supervising physician immediately available to respond to emergencies See telemetry face sheet for immediately available ER MD   ? Location [facility]-Cardiac & Pulmonary Rehab   ? Staff Present Birdie Sons, MPA, RN;Joseph [place], RCP,RRT,BSRT;Melissa [place], RDN, LDN   ? Virtual Visit No   ? Medication changes reported     No   ? Fall or balance concerns reported    No   ? Warm-up and Cool-down Performed on first and last piece of equipment   ? Resistance Training Performed Yes   ? VAD Patient? No   ? PAD/SET Patient? No   ?  ? Pain Assessment  ? Currently in Pain? No/denies   ? ?  ?  ? ?  ? ? ? ? ? ?Social History  ? ?Tobacco Use  ?Smoking Status Former  ? Packs/day: 1.50  ? Years: 30.00  ? Pack years: 45.00  ? Types: Cigarettes  ? Quit date: 01/22/2010  ? Years since quitting: 11.5  ?Smokeless Tobacco Former  ? ? ?Goals Met:  ?Independence with exercise equipment ?Exercise tolerated well ?No report of concerns or symptoms today ?Strength training completed today ? ?Goals Unmet:  ?Not Applicable ? ?Comments: Pt able to follow exercise prescription today without complaint.  Will continue to monitor for progression. ? ? ? ?Dr. Emily Filbert is Medical Director for [place].  ?Dr. Ottie Glazier is Medical Director for [facility] Pulmonary Rehabilitation. ?

## 2021-08-23 DIAGNOSIS — Z955 Presence of coronary angioplasty implant and graft: Secondary | ICD-10-CM | POA: Diagnosis not present

## 2021-08-23 DIAGNOSIS — Z48812 Encounter for surgical aftercare following surgery on the circulatory system: Secondary | ICD-10-CM | POA: Diagnosis not present

## 2021-08-23 NOTE — Progress Notes (Signed)
Daily Session Note ? ?Patient Details  ?Name: Adam Hunter ?MRN: [id number] ?Date of Birth: January 09, 1961 ?Referring Provider:   ?Flowsheet Row Cardiac Rehab from 08/06/2021 in [facility] Cardiac and Pulmonary Rehab  ?Referring Provider Dr. Sallyanne Kuster  ? ?  ? ? ?Encounter Date: 08/23/2021 ? ?Check In: ? Session Check In - 08/23/21 1347   ? ?  ? Check-In  ? Supervising physician immediately available to respond to emergencies See telemetry face sheet for immediately available ER MD   ? Location [facility]-Cardiac & Pulmonary Rehab   ? Staff Present Alberteen Sam, MA, RCEP, CCRP, CCET;Dawnelle Warman, RN, BSN;Joseph [place], [place]   ? Virtual Visit No   ? Medication changes reported     No   ? Fall or balance concerns reported    No   ? Warm-up and Cool-down Performed on first and last piece of equipment   ? Resistance Training Performed Yes   ? VAD Patient? No   ? PAD/SET Patient? No   ?  ? Pain Assessment  ? Currently in Pain? No/denies   ? ?  ?  ? ?  ? ? ? ? ? ?Social History  ? ?Tobacco Use  ?Smoking Status Former  ? Packs/day: 1.50  ? Years: 30.00  ? Pack years: 45.00  ? Types: Cigarettes  ? Quit date: 01/22/2010  ? Years since quitting: 11.5  ?Smokeless Tobacco Former  ? ? ?Goals Met:  ?Independence with exercise equipment ?Exercise tolerated well ?No report of concerns or symptoms today ?Strength training completed today ? ?Goals Unmet:  ?Not Applicable ? ?Comments: Pt able to follow exercise prescription today without complaint.  Will continue to monitor for progression. ? ? ?Dr. Emily Filbert is Medical Director for [place].  ?Dr. Ottie Glazier is Medical Director for [facility] Pulmonary Rehabilitation. ?

## 2021-08-27 ENCOUNTER — Other Ambulatory Visit: Payer: Self-pay | Admitting: Cardiovascular Disease

## 2021-08-27 DIAGNOSIS — Z955 Presence of coronary angioplasty implant and graft: Secondary | ICD-10-CM | POA: Diagnosis not present

## 2021-08-27 DIAGNOSIS — Z48812 Encounter for surgical aftercare following surgery on the circulatory system: Secondary | ICD-10-CM | POA: Diagnosis not present

## 2021-08-27 NOTE — Progress Notes (Signed)
Daily Session Note ? ?Patient Details  ?Name: Adam Hunter ?MRN: [id number] ?Date of Birth: [birth year] ?Referring Provider:   ?Flowsheet Row Cardiac Rehab from 08/06/2021 in [facility] Cardiac and Pulmonary Rehab  ?Referring Provider Dr. Sallyanne Kuster  ? ?  ? ? ?Encounter Date: 08/27/2021 ? ?Check In: ? Session Check In - 08/27/21 1346   ? ?  ? Check-In  ? Supervising physician immediately available to respond to emergencies See telemetry face sheet for immediately available ER MD   ? Location [facility]-Cardiac & Pulmonary Rehab   ? Staff Present Birdie Sons, MPA, Nino Glow, MS, ASCM CEP, Exercise Physiologist;Joseph [place], [place]   ? Virtual Visit No   ? Medication changes reported     No   ? Fall or balance concerns reported    No   ? Warm-up and Cool-down Performed on first and last piece of equipment   ? Resistance Training Performed Yes   ? VAD Patient? No   ? PAD/SET Patient? No   ?  ? Pain Assessment  ? Currently in Pain? No/denies   ? ?  ?  ? ?  ? ? ? ? ? ?Social History  ? ?Tobacco Use  ?Smoking Status Former  ? Packs/day: 1.50  ? Years: 30.00  ? Pack years: 45.00  ? Types: Cigarettes  ? Quit date: 01/22/2010  ? Years since quitting: 11.6  ?Smokeless Tobacco Former  ? ? ?Goals Met:  ?Independence with exercise equipment ?Exercise tolerated well ?No report of concerns or symptoms today ?Strength training completed today ? ?Goals Unmet:  ?Not Applicable ? ?Comments: Pt able to follow exercise prescription today without complaint.  Will continue to monitor for progression. ? ? ? ?Dr. Emily Filbert is Medical Director for [place].  ?Dr. Ottie Glazier is Medical Director for [facility] Pulmonary Rehabilitation. ?

## 2021-08-29 ENCOUNTER — Encounter: Payer: Self-pay | Admitting: *Deleted

## 2021-08-29 DIAGNOSIS — Z955 Presence of coronary angioplasty implant and graft: Secondary | ICD-10-CM

## 2021-08-29 DIAGNOSIS — Z48812 Encounter for surgical aftercare following surgery on the circulatory system: Secondary | ICD-10-CM | POA: Diagnosis not present

## 2021-08-29 NOTE — Progress Notes (Signed)
Cardiac Individual Treatment Plan ? ?Patient Details  ?Name: Adam SacramentoDale L Popoff ?MRN: [id number] ?Date of Birth: 04/15/1960 ?Referring Provider:   ?Flowsheet Row Cardiac Re[facility]06[facility] Cardiac and Pulmonary Rehab  ?Referring Provider Dr. Royann Shiversroitoru  ? ?  ? ? ?Initial Encounter Date:  ?Flowsheet Row [facility]  ?Date 08/06/21  ? ?  ? ? ?Visit Diagnosis: Status post coronary artery stent placement ? ?Patient's Home Medications on Admission: ? ?Current Outpatient Medications:  ?  Ascorbic Acid (VITAMIN C) 1000 MG tablet, Take 1,000 mg by mouth daily., Disp: , Rfl:  ?  aspirin EC 81 MG tablet, Take 1 tablet (81 mg total) by mouth daily., Disp: 90 tablet, Rfl: 3 ?  atorvastatin (LIPITOR) 80 MG tablet, Take 1 tablet (80 mg total) by mouth daily., Disp: 90 tablet, Rfl: 3 ?  Cholecalciferol (VITAMIN D) 50 MCG (2000 UT) tablet, Take 2,000 Units by mouth daily., Disp: , Rfl:  ?  clopidogrel (PLAVIX) 75 MG tablet, TAKE 1 TABLET BY MOUTH DAILY, Disp: 90 tablet, Rfl: 1 ?  dapagliflozin propanediol (FARXIGA) 10 MG TABS tablet, Take 1 tablet (10 mg total) by mouth daily before breakfast., Disp: 90 tablet, Rfl: 1 ?  hydrochlorothiazide (HYDRODIURIL) 25 MG tablet, TAKE 1 TABLET BY MOUTH DAILY, Disp: 90 tablet, Rfl: 3 ?  metFORMIN (GLUCOPHAGE) 500 MG tablet, Take 1 tablet (500 mg total) by mouth 2 (two) times daily., Disp: 180 tablet, Rfl: 1 ?  metoprolol succinate (TOPROL-XL) 100 MG 24 hr tablet, TAKE 1 TABLET BY MOUTH DAILY WITH OR IMMEDIATELY FOLLOWING A MEAL, Disp: 90 tablet, Rfl: 3 ?  nitroGLYCERIN (NITROSTAT) 0.4 MG SL tablet, Place 1 tablet (0.4 mg total) under the tongue every 5 (five) minutes as needed., Disp: 25 tablet, Rfl: 12 ?  olmesartan (BENICAR) 20 MG tablet, Take 1 tablet (20 mg total) by mouth daily., Disp: 90 tablet, Rfl: 3 ?  pantoprazole (PROTONIX) 40 MG tablet, Take 1 tablet (40 mg total) by mouth daily. Further refills by PCP., Disp: 30 tablet, Rfl: 1 ?   sildenafil (VIAGRA) 50 MG tablet, Take 1 tablet (50 mg total) by mouth daily as needed for erectile dysfunction., Disp: 10 tablet, Rfl: 3 ?  Zinc 50 MG TABS, Take 50 mg by mouth 2 (two) times a week. Wednesdays and Sundays, Disp: , Rfl:  ? ?Current Facility-Administered Medications:  ?  sodium chloride flush (NS) 0.9 % injection 3 mL, 3 mL, Intravenous, Q12H, Croitoru, Mihai, MD ? ?Past Medical History: ?Past Medical History:  ?Diagnosis Date  ? CAD (coronary artery disease) 11/25/2012  ? STEMI October 2011 secondary to occlusion of a large septal branch of the LAD artery treated medically  ? Emphysema   ? Heart attack ([facility]) 01/2010  ? non-STEMI  ? Hyperlipidemia   ? Hyperlipidemia 11/25/2012  ? Hypertension   ? OSA on CPAP   ? ? ?Tobacco Use: ?Social History  ? ?Tobacco Use  ?Smoking Status Former  ? Packs/day: 1.50  ? Years: 30.00  ? Pack years: 45.00  ? Types: Cigarettes  ? Quit date: 01/22/2010  ? Years since quitting: 11.6  ?Smokeless Tobacco Former  ? ? ?Labs: ?Review Flowsheet   ? ?  ?  Latest Ref Rng & Units 04/22/2017 08/03/2019 09/17/2019 08/16/2020  ?Labs for [facility] Cardiac and Pulmonary Rehab  ?Cholestrol 100 - 199 mg/dL 409129   [id number]    [id number]    ?LDL (calc) 0 - 99 mg/dL 75   65  55    ?HDL-C >39 mg/dL 27   24    27     ?Trlycerides 0 - 149 mg/dL   629    528    ?Hemoglobin A1c 4.8 - 5.6 %   10.6     ? ?  07/05/2021  ?Labs for [facility] Cardiac and Pulmonary Rehab  ?Cholestrol 117    ?LDL (calc) 60    ?HDL-C 26    ?Trlycerides 186    ?Hemoglobin A1c 6.8    ?  ?  ?  ? ? ? ?Exercise Target Goals: ?Exercise Program Goal: ?Individual exercise prescription set using results from initial 6 min walk test and THRR while considering  patient?s activity barriers and safety.  ? ?Exercise Prescription Goal: ?Initial exercise prescription builds to 30-45 minutes a day of aerobic activity, 2-3 days per week.  Home exercise guidelines will be given to patient during program as part of exercise prescription that the participant will  acknowledge. ? ? ?Education: Aerobic Exercise: ?- Group verbal and visual presentation on the components of exercise prescription. Introduces F.I.T.T principle from ACSM for exercise prescriptions.  Reviews F.I.T.T. principles of aerobic exercise including progression. Written material given at graduation. ?Flowsheet Row Cardiac Rehab from 08/22/2021 in [facility] Cardiac and Pulmonary Rehab  ?Date 08/15/21  ?Educator KL  ?Instruction Review Code 1- Verbalizes Understanding  ? ?  ? ? ?Education: Resistance Exercise: ?- Group verbal and visual presentation on the components of exercise prescription. Introduces F.I.T.T principle from ACSM for exercise prescriptions  Reviews F.I.T.T. principles of resistance exercise including progression. Written material given at graduation. ?Flowsheet Row Cardiac Rehab from 08/22/2021 in [facility] Cardiac and Pulmonary Rehab  ?Date 08/22/21  ?Educator [facility]  ?Instruction Review Code 1- Verbalizes Understanding  ? ?  ? ?  ?Education: Exercise & Equipment Safety: ?- Individual verbal instruction and demonstration of equipment use and safety with use of the equipment. ?Flowsheet Row Cardiac Rehab from 08/22/2021 in [facility] Cardiac and Pulmonary Rehab  ?Date 08/06/21  ?Educator KH  ?Instruction Review Code 1- Verbalizes Understanding  ? ?  ? ? ?Education: Exercise Physiology & General Exercise Guidelines: ?- Group verbal and written instruction with models to review the exercise physiology of the cardiovascular system and associated critical values. Provides general exercise guidelines with specific guidelines to those with heart or lung disease.  ? ? ?Education: Flexibility, Balance, Mind/Body Relaxation: ?- Group verbal and visual presentation with interactive activity on the components of exercise prescription. Introduces F.I.T.T principle from ACSM for exercise prescriptions. Reviews F.I.T.T. principles of flexibility and balance exercise training including progression. Also discusses the mind body  connection.  Reviews various relaxation techniques to help reduce and manage stress (i.e. Deep breathing, progressive muscle relaxation, and visualization). Balance handout provided to take home. Written material given at graduation. ? ? ?Activity Barriers & Risk Stratification: ? Activity Barriers & Cardiac Risk Stratification - 07/27/21 1012   ? ?  ? Activity Barriers & Cardiac Risk Stratification  ? Activity Barriers None   ? Cardiac Risk Stratification Moderate   ? ?  ?  ? ?  ? ? ?6 Minute Walk: ? 6 Minute Walk   ? ? Row Name 08/06/21 1438  ?  ?  ?  ? 6 Minute Walk  ? Phase Initial    ? Distance 1435 feet    ? Walk Time 6 minutes    ? # of Rest Breaks 0    ? MPH 2.72    ? METS 3.46    ?  RPE 10    ? Perceived Dyspnea  0    ? VO2 Peak 12.12    ? Symptoms No    ? Resting HR 85 bpm    ? Resting BP 122/82    ? Resting Oxygen Saturation  96 %    ? Exercise Oxygen Saturation  during 6 min walk 96 %    ? Max Ex. HR 109 bpm    ? Max Ex. BP 142/80    ? 2 Minute Post BP 114/72    ? ?  ?  ? ?  ? ? ?Oxygen Initial Assessment: ? ? ?Oxygen Re-Evaluation: ? ? ?Oxygen Discharge (Final Oxygen Re-Evaluation): ? ? ?Initial Exercise Prescription: ? Initial Exercise Prescription - 08/06/21 1400   ? ?  ? Date of Initial Exercise RX and Referring Provider  ? Date 08/06/21   ? Referring Provider Dr. Royann Shivers   ?  ? Oxygen  ? Maintain Oxygen Saturation 88% or higher   ?  ? Treadmill  ? MPH 3   ? Grade 1   ? Minutes 15   ? METs 3.71   ?  ? Recumbant Bike  ? Level 3   ? RPM 60   ? Minutes 15   ? METs 3.46   ?  ? NuStep  ? Level 4   ? SPM 80   ? Minutes 15   ? METs 3.46   ?  ? Elliptical  ? Level 1   ? Speed 3   ? Minutes 15   ? METs 3.46   ?  ? REL-XR  ? Level 3   ? Speed 50   ? Minutes 15   ? METs 3.46   ?  ? Biostep-RELP  ? Level 3   ? SPM 50   ? Minutes 15   ? METs 3.46   ?  ? Prescription Details  ? Frequency (times per week) 3   ? Duration Progress to 30 minutes of continuous aerobic without signs/symptoms of physical distress   ?  ?  Intensity  ? THRR 40-80% of Max Heartrate 115-145   ? Ratings of Perceived Exertion 11-13   ? Perceived Dyspnea 0-4   ?  ? Progression  ? Progression Continue to progress workloads to maintain intensity witho

## 2021-08-29 NOTE — Progress Notes (Signed)
Daily Session Note ? ?Patient Details  ?Name: RUFINO STAUP ?MRN: [id number] ?Date of Birth: 1961-02-11 ?Referring Provider:   ?Flowsheet Row Cardiac Rehab from 08/06/2021 in [facility] Cardiac and Pulmonary Rehab  ?Referring Provider Dr. Sallyanne Kuster  ? ?  ? ? ?Encounter Date: 08/29/2021 ? ?Check In: ? Session Check In - 08/29/21 1330   ? ?  ? Check-In  ? Supervising physician immediately available to respond to emergencies See telemetry face sheet for immediately available ER MD   ? Location [facility]-Cardiac & Pulmonary Rehab   ? Staff Present Birdie Sons, MPA, RN;Melissa [place], RDN, LDN;Joseph [place], RCP,RRT,BSRT   ? Virtual Visit No   ? Medication changes reported     No   ? Fall or balance concerns reported    No   ? Warm-up and Cool-down Performed on first and last piece of equipment   ? Resistance Training Performed Yes   ? VAD Patient? No   ? PAD/SET Patient? No   ?  ? Pain Assessment  ? Currently in Pain? No/denies   ? ?  ?  ? ?  ? ? ? ? ? ?Social History  ? ?Tobacco Use  ?Smoking Status Former  ? Packs/day: 1.50  ? Years: 30.00  ? Pack years: 45.00  ? Types: Cigarettes  ? Quit date: 01/22/2010  ? Years since quitting: 11.6  ?Smokeless Tobacco Former  ? ? ?Goals Met:  ?Independence with exercise equipment ?Exercise tolerated well ?No report of concerns or symptoms today ?Strength training completed today ? ?Goals Unmet:  ?Not Applicable ? ?Comments: Pt able to follow exercise prescription today without complaint.  Will continue to monitor for progression. ? ? ? ?Dr. Emily Filbert is Medical Director for [place].  ?Dr. Ottie Glazier is Medical Director for [facility] Pulmonary Rehabilitation. ?

## 2021-08-30 DIAGNOSIS — Z48812 Encounter for surgical aftercare following surgery on the circulatory system: Secondary | ICD-10-CM | POA: Diagnosis not present

## 2021-08-30 DIAGNOSIS — Z955 Presence of coronary angioplasty implant and graft: Secondary | ICD-10-CM | POA: Diagnosis not present

## 2021-08-30 NOTE — Progress Notes (Signed)
Daily Session Note  Patient Details  Name: Adam Hunter MRN: [id number] Date of Birth: 12/01/1960 Referring Provider:   Flowsheet Row Cardiac Rehab from 08/06/2021 in [facility] Cardiac and Pulmonary Rehab  Referring Provider Dr. Sallyanne Kuster       Encounter Date: 08/30/2021  Check In:  Session Check In - 08/30/21 1343       Check-In   Supervising physician immediately available to respond to emergencies See telemetry face sheet for immediately available ER MD    Location [facility]-Cardiac & Pulmonary Rehab    Staff Present Alberteen Sam, MA, RCEP, CCRP, CCET;Lonita Debes, RN, BSN;Joseph [place], [place]    Virtual Visit No    Medication changes reported     No    Fall or balance concerns reported    No    Tobacco Cessation No Change    Warm-up and Cool-down Performed on first and last piece of equipment    Resistance Training Performed Yes    VAD Patient? No    PAD/SET Patient? No      Pain Assessment   Currently in Pain? No/denies                Social History   Tobacco Use  Smoking Status Former   Packs/day: 1.50   Years: 30.00   Pack years: 45.00   Types: Cigarettes   Quit date: 01/22/2010   Years since quitting: 11.6  Smokeless Tobacco Former    Goals Met:  Proper associated with RPD/PD & O2 Sat Independence with exercise equipment Exercise tolerated well No report of concerns or symptoms today Strength training completed today  Goals Unmet:  Not Applicable  Comments: Pt able to follow exercise prescription today without complaint.  Will continue to monitor for progression.   Dr. Emily Filbert is Medical Director for [name].  Dr. Ottie Glazier is Medical Director for [name] Pulmonary Rehabilitation.

## 2021-09-03 DIAGNOSIS — Z48812 Encounter for surgical aftercare following surgery on the circulatory system: Secondary | ICD-10-CM | POA: Diagnosis not present

## 2021-09-03 DIAGNOSIS — Z955 Presence of coronary angioplasty implant and graft: Secondary | ICD-10-CM

## 2021-09-03 NOTE — Progress Notes (Signed)
Daily Session Note  Patient Details  Name: Adam Hunter MRN: [id number] Date of Birth: Jul 18, 1960 Referring Provider:   Flowsheet Row Cardiac Rehab from 08/06/2021 in [facility] Cardiac and Pulmonary Rehab  Referring Provider Dr. Sallyanne Kuster       Encounter Date: 09/03/2021  Check In:  Session Check In - 09/03/21 1322       Check-In   Supervising physician immediately available to respond to emergencies See telemetry face sheet for immediately available ER MD    Location [facility] Pul[facility] Re[facility]    Staff Present Birdie Sons, MPA, Nino Glow, MS, ASCM CEP, Exercise Physiologist;Joseph Tessie Fass, [place]    Virtual Visit No    Medication changes reported     No    Fall or balance concerns reported    No    Tobacco Cessation No Change    Warm-up and Cool-down Performed on first and last piece of equipment    Resistance Training Performed Yes    VAD Patient? No    PAD/SET Patient? No      Pain Assessment   Currently in Pain? No/denies                Social History   Tobacco Use  Smoking Status Former   Packs/day: 1.50   Years: 30.00   Pack years: 45.00   Types: Cigarettes   Quit date: 01/22/2010   Years since quitting: 11.6  Smokeless Tobacco Former    Goals Met:  Independence with exercise equipment Exercise tolerated well No report of concerns or symptoms today Strength training completed today  Goals Unmet:  Not Applicable  Comments: Pt able to follow exercise prescription today without complaint.  Will continue to monitor for progression.    Dr. Emily Filbert is Medical Director for [place].  Dr. Ottie Glazier is Medical Director for [facility] Pulmonary Rehabilitation.

## 2021-09-04 DIAGNOSIS — Z955 Presence of coronary angioplasty implant and graft: Secondary | ICD-10-CM

## 2021-09-04 NOTE — Progress Notes (Signed)
Completed initial RD consultation ?

## 2021-09-05 DIAGNOSIS — Z955 Presence of coronary angioplasty implant and graft: Secondary | ICD-10-CM

## 2021-09-05 DIAGNOSIS — Z48812 Encounter for surgical aftercare following surgery on the circulatory system: Secondary | ICD-10-CM | POA: Diagnosis not present

## 2021-09-05 NOTE — Progress Notes (Signed)
Daily Session Note  Patient Details  Name: Adam Hunter MRN: [id number] Date of Birth: 04/17/1960 Referring Provider:   Flowsheet Row Cardiac Rehab from 08/06/2021 in [facility] Cardiac and Pulmonary Rehab  Referring Provider Dr. Croitoru       Encounter Date: 09/05/2021  Check In:  Session Check In - 09/05/21 1348       Check-In   Supervising physician immediately available to respond to emergencies See telemetry face sheet for immediately available ER MD    Location [facility]Card[facility] Pul[facility] Re[facility]    Staff Present Kelly Bollinger, MPA, RN;Melissa Caiola, RDN, LDN;Kara Langdon, MS, ASCM CEP, Exercise Physiologist;Joseph Hood, RCP,RRT,BSRT    Virtual Visit No    Medication changes reported     No    Fall or balance concerns reported    No    Tobacco Cessation No Change    Warm-up and Cool-down Performed on first and last piece of equipment    Resistance Training Performed Yes    VAD Patient? No    PAD/SET Patient? No      Pain Assessment   Currently in Pain? No/denies                Social History   Tobacco Use  Smoking Status Former   Packs/day: 1.50   Years: 30.00   Pack years: 45.00   Types: Cigarettes   Quit date: 01/22/2010   Years since quitting: 11.6  Smokeless Tobacco Former    Goals Met:  Independence with exercise equipment Exercise tolerated well No report of concerns or symptoms today Strength training completed today  Goals Unmet:  Not Applicable  Comments: Pt able to follow exercise prescription today without complaint.  Will continue to monitor for progression.    Dr. Mark Miller is Medical Director for [facility].  Dr. Fuad Aleskerov is Medical Director for [facility] Pulmonary Rehabilitation. 

## 2021-09-06 ENCOUNTER — Encounter: Payer: BC Managed Care – PPO | Admitting: *Deleted

## 2021-09-06 DIAGNOSIS — Z48812 Encounter for surgical aftercare following surgery on the circulatory system: Secondary | ICD-10-CM | POA: Diagnosis not present

## 2021-09-06 DIAGNOSIS — Z955 Presence of coronary angioplasty implant and graft: Secondary | ICD-10-CM | POA: Diagnosis not present

## 2021-09-06 NOTE — Progress Notes (Signed)
Daily Session Note  Patient Details  Name: DELMONT PROSCH MRN: [id number] Date of Birth: 1960/11/10 Referring Provider:   Flowsheet Row Cardiac Rehab from 08/06/2021 in [facility] Cardiac and Pulmonary Rehab  Referring Provider Dr. Sallyanne Kuster       Encounter Date: 09/06/2021  Check In:  Session Check In - 09/06/21 1350       Check-In   Supervising physician immediately available to respond to emergencies See telemetry face sheet for immediately available ER MD    Location [facility] Pul[facility]    Staff Present Nyoka Cowden, RN, BSN, Willette Pa, MA, RCEP, CCRP, CCET;Joseph [place], [place]    Virtual Visit No    Medication changes reported     No    Fall or balance concerns reported    No    Tobacco Cessation No Change    Warm-up and Cool-down Performed on first and last piece of equipment    Resistance Training Performed Yes    VAD Patient? No                Social History   Tobacco Use  Smoking Status Former   Packs/day: 1.50   Years: 30.00   Pack years: 45.00   Types: Cigarettes   Quit date: 01/22/2010   Years since quitting: 11.6  Smokeless Tobacco Former    Goals Met:  Independence with exercise equipment Exercise tolerated well No report of concerns or symptoms today  Goals Unmet:  Not Applicable  Comments: Pt able to follow exercise prescription today without complaint.  Will continue to monitor for progression.    Dr. Emily Filbert is Medical Director for [place].  Dr. Ottie Glazier is Medical Director for [facility] Pulmonary Rehabilitation.

## 2021-09-12 ENCOUNTER — Encounter: Payer: BC Managed Care – PPO | Admitting: *Deleted

## 2021-09-12 ENCOUNTER — Other Ambulatory Visit: Payer: Self-pay | Admitting: *Deleted

## 2021-09-12 DIAGNOSIS — Z955 Presence of coronary angioplasty implant and graft: Secondary | ICD-10-CM

## 2021-09-12 DIAGNOSIS — Z48812 Encounter for surgical aftercare following surgery on the circulatory system: Secondary | ICD-10-CM | POA: Diagnosis not present

## 2021-09-12 MED ORDER — PANTOPRAZOLE SODIUM 40 MG PO TBEC: 40.0000 mg | DELAYED_RELEASE_TABLET | Freq: Every day | ORAL | 1 refills | Status: AC

## 2021-09-12 NOTE — Progress Notes (Signed)
Daily Session Note  Patient Details  Name: Adam Hunter MRN: [id number] Date of Birth: May 09, 1960 Referring Provider:   Flowsheet Row Cardiac Rehab from 08/06/2021 in [facility] Cardiac and Pulmonary Rehab  Referring Provider Dr. Sallyanne Kuster       Encounter Date: 09/12/2021  Check In:  Session Check In - 09/12/21 1348       Check-In   Supervising physician immediately available to respond to emergencies See telemetry face sheet for immediately available ER MD    Location [facility] Pul[facility] Re[facility]    Staff Present Nyoka Cowden, RN, BSN, Tyna Jaksch, MS, ASCM CEP, Exercise Physiologist;Joseph Tessie Fass, [place]    Virtual Visit No    Medication changes reported     No    Fall or balance concerns reported    No    Tobacco Cessation No Change    Warm-up and Cool-down Performed on first and last piece of equipment    Resistance Training Performed Yes    VAD Patient? No    PAD/SET Patient? No      Pain Assessment   Currently in Pain? No/denies                Social History   Tobacco Use  Smoking Status Former   Packs/day: 1.50   Years: 30.00   Pack years: 45.00   Types: Cigarettes   Quit date: 01/22/2010   Years since quitting: 11.6  Smokeless Tobacco Former    Goals Met:  Independence with exercise equipment Exercise tolerated well No report of concerns or symptoms today  Goals Unmet:  Not Applicable  Comments: Pt able to follow exercise prescription today without complaint.  Will continue to monitor for progression.    Dr. Emily Filbert is Medical Director for [place].  Dr. Ottie Glazier is Medical Director for [facility] Pulmonary Rehabilitation.

## 2021-09-13 ENCOUNTER — Encounter: Payer: BC Managed Care – PPO | Attending: Cardiovascular Disease

## 2021-09-13 DIAGNOSIS — Z955 Presence of coronary angioplasty implant and graft: Secondary | ICD-10-CM | POA: Diagnosis not present

## 2021-09-13 NOTE — Progress Notes (Signed)
Daily Session Note  Patient Details  Name: Adam Hunter MRN: [id number] Date of Birth: 1960-08-07 Referring Provider:   Flowsheet Row Cardiac Rehab from 08/06/2021 in [facility] Cardiac and Pulmonary Rehab  Referring Provider Dr. Sallyanne Kuster       Encounter Date: 09/13/2021  Check In:  Session Check In - 09/13/21 1338       Check-In   Supervising physician immediately available to respond to emergencies See telemetry face sheet for immediately available ER MD    Location [facility] Pulmonary Re[facility]    Staff Present Alberteen Sam, MA, RCEP, CCRP, CCET;Ashana Tullo, RN, BSN;Joseph [place], [place]    Virtual Visit No    Medication changes reported     No    Fall or balance concerns reported    No    Tobacco Cessation No Change    Warm-up and Cool-down Performed on first and last piece of equipment    Resistance Training Performed Yes    VAD Patient? No    PAD/SET Patient? No      Pain Assessment   Currently in Pain? No/denies                Social History   Tobacco Use  Smoking Status Former   Packs/day: 1.50   Years: 30.00   Pack years: 45.00   Types: Cigarettes   Quit date: 01/22/2010   Years since quitting: 11.6  Smokeless Tobacco Former    Goals Met:  Proper associated with RPD/PD & O2 Sat Independence with exercise equipment Exercise tolerated well No report of concerns or symptoms today Strength training completed today  Goals Unmet:  Not Applicable  Comments: Pt able to follow exercise prescription today without complaint.  Will continue to monitor for progression.   Dr. Emily Filbert is Medical Director for [place].  Dr. Ottie Glazier is Medical Director for [facility] Pulmonary Rehabilitation.

## 2021-09-17 DIAGNOSIS — Z955 Presence of coronary angioplasty implant and graft: Secondary | ICD-10-CM | POA: Diagnosis not present

## 2021-09-17 NOTE — Progress Notes (Signed)
Daily Session Note  Patient Details  Name: Adam Hunter MRN: [id number] Date of Birth: July 30, 1960 Referring Provider:   Flowsheet Row Cardiac Rehab from 08/06/2021 in [facility] Cardiac and Pulmonary Rehab  Referring Provider Dr. Sallyanne Kuster       Encounter Date: 09/17/2021  Check In:  Session Check In - 09/17/21 1336       Check-In   Supervising physician immediately available to respond to emergencies See telemetry face sheet for immediately available ER MD    Location [facility] Pul[facility] Re[facility]    Staff Present Birdie Sons, MPA, Nino Glow, MS, ASCM CEP, Exercise Physiologist;Joseph Tessie Fass, [place]    Virtual Visit No    Medication changes reported     No    Fall or balance concerns reported    No    Tobacco Cessation No Change    Warm-up and Cool-down Performed on first and last piece of equipment    Resistance Training Performed Yes    VAD Patient? No    PAD/SET Patient? No      Pain Assessment   Currently in Pain? No/denies                Social History   Tobacco Use  Smoking Status Former   Packs/day: 1.50   Years: 30.00   Pack years: 45.00   Types: Cigarettes   Quit date: 01/22/2010   Years since quitting: 11.6  Smokeless Tobacco Former    Goals Met:  Independence with exercise equipment Exercise tolerated well No report of concerns or symptoms today Strength training completed today  Goals Unmet:  Not Applicable  Comments: Pt able to follow exercise prescription today without complaint.  Will continue to monitor for progression.    Dr. Emily Filbert is Medical Director for [facility].  Dr. Ottie Glazier is Medical Director for [facility] Pulmonary Rehabilitation.

## 2021-09-19 ENCOUNTER — Encounter: Payer: BC Managed Care – PPO | Admitting: *Deleted

## 2021-09-19 DIAGNOSIS — Z955 Presence of coronary angioplasty implant and graft: Secondary | ICD-10-CM | POA: Diagnosis not present

## 2021-09-19 NOTE — Progress Notes (Signed)
Daily Session Note  Patient Details  Name: Adam Hunter MRN: [id number] Date of Birth: 1960/10/22 Referring Provider:   Flowsheet Row Cardiac Rehab from 08/06/2021 in [facility] Cardiac and Pulmonary Rehab  Referring Provider Dr. Sallyanne Kuster       Encounter Date: 09/19/2021  Check In:  Session Check In - 09/19/21 1406       Check-In   Supervising physician immediately available to respond to emergencies See telemetry face sheet for immediately available ER MD    Location [facility]Card[facility] Pulmonary Rehab    Staff Present Heath Lark, RN, BSN, CCRP;Kelly Bollinger, MPA, RN;Joseph [place], [place]    Virtual Visit No    Medication changes reported     No    Fall or balance concerns reported    No    Warm-up and Cool-down Performed on first and last piece of equipment    Resistance Training Performed Yes    VAD Patient? No    PAD/SET Patient? No      Pain Assessment   Currently in Pain? No/denies                Social History   Tobacco Use  Smoking Status Former   Packs/day: 1.50   Years: 30.00   Pack years: 45.00   Types: Cigarettes   Quit date: 01/22/2010   Years since quitting: 11.6  Smokeless Tobacco Former    Goals Met:  Independence with exercise equipment Exercise tolerated well No report of concerns or symptoms today  Goals Unmet:  Not Applicable  Comments: Pt able to follow exercise prescription today without complaint.  Will continue to monitor for progression.    Dr. Emily Filbert is Medical Director for [place].  Dr. Ottie Glazier is Medical Director for [facility] Pulmonary Rehabilitation.

## 2021-09-20 DIAGNOSIS — Z955 Presence of coronary angioplasty implant and graft: Secondary | ICD-10-CM

## 2021-09-20 NOTE — Progress Notes (Signed)
Daily Session Note  Patient Details  Name: Adam Hunter MRN: [id number] Date of Birth: 1960-09-30 Referring Provider:   Flowsheet Row Cardiac Rehab from 08/06/2021 in [facility] Cardiac and Pulmonary Rehab  Referring Provider Dr. Sallyanne Kuster       Encounter Date: 09/20/2021  Check In:  Session Check In - 09/20/21 1334       Check-In   Supervising physician immediately available to respond to emergencies See telemetry face sheet for immediately available ER MD    Location [facility]Cardiac [facility] Pulmonary Rehab    Staff Present Birdie Sons, MPA, RN;Jessica [place], MA, RCEP, CCRP, CCET;Joseph [place], [place]    Virtual Visit No    Medication changes reported     No    Fall or balance concerns reported    No    Tobacco Cessation No Change    Warm-up and Cool-down Performed on first and last piece of equipment    Resistance Training Performed Yes    VAD Patient? No    PAD/SET Patient? No      Pain Assessment   Currently in Pain? No/denies                Social History   Tobacco Use  Smoking Status Former   Packs/day: 1.50   Years: 30.00   Total pack years: 45.00   Types: Cigarettes   Quit date: 01/22/2010   Years since quitting: 11.6  Smokeless Tobacco Former    Goals Met:  Independence with exercise equipment Exercise tolerated well No report of concerns or symptoms today Strength training completed today  Goals Unmet:  Not Applicable  Comments: Pt able to follow exercise prescription today without complaint.  Will continue to monitor for progression.    Dr. Emily Filbert is Medical Director for [place].  Dr. Ottie Glazier is Medical Director for [facility] Pulmonary Rehabilitation.

## 2021-09-24 ENCOUNTER — Encounter: Payer: BC Managed Care – PPO | Admitting: *Deleted

## 2021-09-24 DIAGNOSIS — Z955 Presence of coronary angioplasty implant and graft: Secondary | ICD-10-CM | POA: Diagnosis not present

## 2021-09-24 NOTE — Progress Notes (Signed)
Daily Session Note  Patient Details  Name: Adam Hunter MRN: [id number] Date of Birth: 01/15/1961 Referring Provider:   Flowsheet Row Cardiac Rehab from 08/06/2021 in [facility] Cardiac and Pulmonary Rehab  Referring Provider Dr. Croitoru       Encounter Date: 09/24/2021  Check In:  Session Check In - 09/24/21 1414       Check-In   Supervising physician immediately available to respond to emergencies See telemetry face sheet for immediately available ER MD    Location [facility]-Cardiac [facility] Pulmonary Rehab    Staff Present Susanne Bice, RN, BSN, CCRP;Jessica [place], MA, RCEP, CCRP, CCET;Joseph Hood, RCP,RRT,BSRT    Virtual Visit No    Medication changes reported     No    Fall or balance concerns reported    No    Warm-up and Cool-down Performed on first and last piece of equipment    Resistance Training Performed Yes    VAD Patient? No    PAD/SET Patient? No      Pain Assessment   Currently in Pain? No/denies                Social History   Tobacco Use  Smoking Status Former   Packs/day: 1.50   Years: 30.00   Total pack years: 45.00   Types: Cigarettes   Quit date: 01/22/2010   Years since quitting: 11.6  Smokeless Tobacco Former    Goals Met:  Independence with exercise equipment Exercise tolerated well No report of concerns or symptoms today  Goals Unmet:  Not Applicable  Comments: Pt able to follow exercise prescription today without complaint.  Will continue to monitor for progression.    Dr. Mark Miller is Medical Director for [facility].  Dr. Fuad Aleskerov is Medical Director for [facility] Pulmonary Rehabilitation. 

## 2021-09-26 ENCOUNTER — Encounter: Payer: Self-pay | Admitting: *Deleted

## 2021-09-26 ENCOUNTER — Encounter: Payer: BC Managed Care – PPO | Admitting: *Deleted

## 2021-09-26 DIAGNOSIS — Z955 Presence of coronary angioplasty implant and graft: Secondary | ICD-10-CM

## 2021-09-26 NOTE — Progress Notes (Signed)
Cardiac Individual Treatment Plan  Patient Details  Name: Adam Hunter MRN: [id number] Date of Birth: 22-Jan-1961 Referring Provider:   Flowsheet Row Cardiac Rehab from 08/06/2021 in [facility] Cardiac and Pulmonary Rehab  Referring Provider Dr. Sallyanne Kuster       Initial Encounter Date:  Flowsheet Row Cardiac Rehab from 08/06/2021 in [facility] Cardiac and Pulmonary Rehab  Date 08/06/21       Visit Diagnosis: Status post coronary artery stent placement  Patient's Home Medications on Admission:  Current Outpatient Medications:    Ascorbic Acid (VITAMIN C) 1000 MG tablet, Take 1,000 mg by mouth daily., Disp: , Rfl:    aspirin EC 81 MG tablet, Take 1 tablet (81 mg total) by mouth daily., Disp: 90 tablet, Rfl: 3   atorvastatin (LIPITOR) 80 MG tablet, Take 1 tablet (80 mg total) by mouth daily., Disp: 90 tablet, Rfl: 3   Cholecalciferol (VITAMIN D) 50 MCG (2000 UT) tablet, Take 2,000 Units by mouth daily., Disp: , Rfl:    clopidogrel (PLAVIX) 75 MG tablet, TAKE 1 TABLET BY MOUTH DAILY, Disp: 90 tablet, Rfl: 1   dapagliflozin propanediol (FARXIGA) 10 MG TABS tablet, Take 1 tablet (10 mg total) by mouth daily before breakfast., Disp: 90 tablet, Rfl: 1   hydrochlorothiazide (HYDRODIURIL) 25 MG tablet, TAKE 1 TABLET BY MOUTH DAILY, Disp: 90 tablet, Rfl: 3   metFORMIN (GLUCOPHAGE) 500 MG tablet, Take 1 tablet (500 mg total) by mouth 2 (two) times daily., Disp: 180 tablet, Rfl: 1   metoprolol succinate (TOPROL-XL) 100 MG 24 hr tablet, TAKE 1 TABLET BY MOUTH DAILY WITH OR IMMEDIATELY FOLLOWING A MEAL, Disp: 90 tablet, Rfl: 3   nitroGLYCERIN (NITROSTAT) 0.4 MG SL tablet, Place 1 tablet (0.4 mg total) under the tongue every 5 (five) minutes as needed., Disp: 25 tablet, Rfl: 12   olmesartan (BENICAR) 20 MG tablet, Take 1 tablet (20 mg total) by mouth daily., Disp: 90 tablet, Rfl: 3   pantoprazole (PROTONIX) 40 MG tablet, Take 1 tablet (40 mg total) by mouth daily. Further refills by PCP., Disp: 30 tablet, Rfl: 1    sildenafil (VIAGRA) 50 MG tablet, Take 1 tablet (50 mg total) by mouth daily as needed for erectile dysfunction., Disp: 10 tablet, Rfl: 3   Zinc 50 MG TABS, Take 50 mg by mouth 2 (two) times a week. Wednesdays and Sundays, Disp: , Rfl:   Current Facility-Administered Medications:    sodium chloride flush (NS) 0.9 % injection 3 mL, 3 mL, Intravenous, Q12H, Croitoru, Mihai, MD  Past Medical History: Past Medical History:  Diagnosis Date   CAD (coronary artery disease) 11/25/2012   STEMI October 2011 secondary to occlusion of a large septal branch of the LAD artery treated medically   Emphysema    Heart attack ([place]) 01/2010   non-STEMI   Hyperlipidemia    Hyperlipidemia 11/25/2012   Hypertension    OSA on CPAP     Tobacco Use: Social History   Tobacco Use  Smoking Status Former   Packs/day: 1.50   Years: 30.00   Total pack years: 45.00   Types: Cigarettes   Quit date: 01/22/2010   Years since quitting: 11.6  Smokeless Tobacco Former    Labs: Review Flowsheet  More data exists      Latest Ref Rng & Units 04/22/2017 08/03/2019 09/17/2019 08/16/2020  Labs for ITP Cardiac and Pulmonary Rehab  Cholestrol 100 - 199 mg/dL 129  131  - 119   LDL (calc) 0 - 99 mg/dL 75  65  -  55   HDL-C >39 mg/dL 27  24  - 27   Trlycerides 0 - 149 mg/dL 134  257  - 228   Hemoglobin A1c 4.8 - 5.6 % - - 10.6  -      07/05/2021  Labs for ITP Cardiac and Pulmonary Rehab  Cholestrol 117   LDL (calc) 60   HDL-C 26   Trlycerides 186   Hemoglobin A1c 6.8      Exercise Target Goals: Exercise Program Goal: Individual exercise prescription set using results from initial 6 min walk test and THRR while considering  patient's activity barriers and safety.   Exercise Prescription Goal: Initial exercise prescription builds to 30-45 minutes a day of aerobic activity, 2-3 days per week.  Home exercise guidelines will be given to patient during program as part of exercise prescription that the participant will  acknowledge.   Education: Aerobic Exercise: - Group verbal and visual presentation on the components of exercise prescription. Introduces F.I.T.T principle from ACSM for exercise prescriptions.  Reviews F.I.T.T. principles of aerobic exercise including progression. Written material given at graduation. Flowsheet Row Cardiac Rehab from 09/19/2021 in [facility] Cardiac and Pulmonary Rehab  Date 08/15/21  Educator [place]  Instruction Review Code 1- Verbalizes Understanding       Education: Resistance Exercise: - Group verbal and visual presentation on the components of exercise prescription. Introduces F.I.T.T principle from ACSM for exercise prescriptions  Reviews F.I.T.T. principles of resistance exercise including progression. Written material given at graduation. Flowsheet Row Cardiac Rehab from 09/19/2021 in [facility] Cardiac and Pulmonary Rehab  Date 08/22/21  Educator [facility]  Instruction Review Code 1- Verbalizes Understanding        Education: Exercise & Equipment Safety: - Individual verbal instruction and demonstration of equipment use and safety with use of the equipment. Flowsheet Row Cardiac Rehab from 09/19/2021 in [facility] Cardiac and Pulmonary Rehab  Date 08/06/21  Educator [facility]  Instruction Review Code 1- Verbalizes Understanding       Education: Exercise Physiology & General Exercise Guidelines: - Group verbal and written instruction with models to review the exercise physiology of the cardiovascular system and associated critical values. Provides general exercise guidelines with specific guidelines to those with heart or lung disease.    Education: Flexibility, Balance, Mind/Body Relaxation: - Group verbal and visual presentation with interactive activity on the components of exercise prescription. Introduces F.I.T.T principle from ACSM for exercise prescriptions. Reviews F.I.T.T. principles of flexibility and balance exercise training including progression. Also discusses the mind body  connection.  Reviews various relaxation techniques to help reduce and manage stress (i.e. Deep breathing, progressive muscle relaxation, and visualization). Balance handout provided to take home. Written material given at graduation. Flowsheet Row Cardiac Rehab from 09/19/2021 in [facility] Cardiac and Pulmonary Rehab  Date 08/29/21  Educator [place]  Instruction Review Code 1- Verbalizes Understanding       Activity Barriers & Risk Stratification:  Activity Barriers & Cardiac Risk Stratification - 07/27/21 1012       Activity Barriers & Cardiac Risk Stratification   Activity Barriers None    Cardiac Risk Stratification Moderate             6 Minute Walk:  6 Minute Walk     Row Name 08/06/21 1438         6 Minute Walk   Phase Initial     Distance 1435 feet     Walk Time 6 minutes     # of Rest Breaks 0  MPH 2.72     METS 3.46     RPE 10     Perceived Dyspnea  0     VO2 Peak 12.12     Symptoms No     Resting HR 85 bpm     Resting BP 122/82     Resting Oxygen Saturation  96 %     Exercise Oxygen Saturation  during 6 min walk 96 %     Max Ex. HR 109 bpm     Max Ex. BP 142/80     2 Minute Post BP 114/72              Oxygen Initial Assessment:   Oxygen Re-Evaluation:   Oxygen Discharge (Final Oxygen Re-Evaluation):   Initial Exercise Prescription:  Initial Exercise Prescription - 08/06/21 1400       Date of Initial Exercise RX and Referring Provider   Date 08/06/21    Referring Provider Dr. Sallyanne Kuster      Oxygen   Maintain Oxygen Saturation 88% or higher      Treadmill   MPH 3    Grade 1    Minutes 15    METs 3.71      Recumbant Bike   Level 3    RPM 60    Minutes 15    METs 3.46      NuStep   Level 4    SPM 80    Minutes 15    METs 3.46      Elliptical   Level 1    Speed 3    Minutes 15    METs 3.46      REL-XR   Level 3    Speed 50    Minutes 15    METs 3.46      Biostep-RELP   Level 3    SPM 50    Minutes 15    METs 3.46       Prescription Details   Frequency (times per week) 3    Duration Progress to 30 minutes of continuous aerobic without signs/symptoms of physical distress      Intensity   THRR 40-80% of Max Heartrate 115-145    Ratings of Perceived Exertion 11-13    Perceived Dyspnea 0-4      Progression   Progression Continue to progress workloads to maintain intensity without signs/symptoms of physical distress.      Resistance Training   Training Prescription Yes    Weight 4    Reps 10-15             Perform Capillary Blood Glucose checks as needed.  Exercise Prescription Changes:   Exercise Prescription Changes     Row Name 08/06/21 1400 08/16/21 1300 08/16/21 1500 08/29/21 1600 09/26/21 1200     Response to Exercise   Blood Pressure (Admit) 122/82 -- 102/60 132/70 134/72   Blood Pressure (Exercise) 142/80 -- 144/70 136/70 --   Blood Pressure (Exit) 114/72 -- 102/60 124/70 126/70   Heart Rate (Admit) 85 bpm -- 92 bpm 83 bpm 65 bpm   Heart Rate (Exercise) 109 bpm -- 123 bpm 127 bpm 109 bpm   Heart Rate (Exit) 73 bpm -- 122 bpm 100 bpm 85 bpm   Oxygen Saturation (Admit) 96 % -- -- -- --   Oxygen Saturation (Exercise) 96 % -- -- -- --   Oxygen Saturation (Exit) 96 % -- -- -- --   Rating of Perceived Exertion (Exercise) 10 -- 12 14 13  Perceived Dyspnea (Exercise) 0 -- -- -- --   Symptoms none -- none none none   Comments 6 MWT results -- -- -- --   Duration -- -- Continue with 30 min of aerobic exercise without signs/symptoms of physical distress. Continue with 30 min of aerobic exercise without signs/symptoms of physical distress. Continue with 30 min of aerobic exercise without signs/symptoms of physical distress.   Intensity -- -- THRR unchanged THRR unchanged THRR unchanged     Progression   Progression -- -- Continue to progress workloads to maintain intensity without signs/symptoms of physical distress. Continue to progress workloads to maintain intensity without  signs/symptoms of physical distress. Continue to progress workloads to maintain intensity without signs/symptoms of physical distress.   Average METs -- -- 2.91 3.92 3.94     Resistance Training   Training Prescription -- -- Yes Yes Yes   Weight -- -- 4 lb 5 lb 6 lb   Reps -- -- 10-15 10-15 10-15     Interval Training   Interval Training -- -- No No No     Treadmill   MPH -- -- 2._0 Grade -- -- _1 Minutes -- -- _2 METs -- -- 3.53 4.95 4.54     Recumbant Bike   Level -- -- 2 -- --   Minutes -- -- 15 -- --   METs -- -- 3.2 -- --     NuStep   Level -- -- 3 -- 4   Minutes -- -- 15 -- 15   METs -- -- 2.4 -- 2.5     Recumbant Elliptical   Level -- -- -- 3 --   Minutes -- -- -- 15 --   METs -- -- -- 2.5 --     REL-XR   Level -- -- 3 4 --   Minutes -- -- 15 15 --   METs -- -- 2.5 4.3 --     Track   Laps -- -- -- -- 44   Minutes -- -- -- -- 15   METs -- -- -- -- 3.39     Home Exercise Plan   Plans to continue exercise at -- Home (comment)  walking Home (comment)  walking Home (comment)  walking Home (comment)  walking   Frequency -- Add 2 additional days to program exercise sessions. Add 2 additional days to program exercise sessions. Add 2 additional days to program exercise sessions. Add 2 additional days to program exercise sessions.   Initial Home Exercises Provided -- 08/16/21 08/16/21 08/16/21 08/16/21     Oxygen   Maintain Oxygen Saturation -- -- 88% or higher 88% or higher 88% or higher            Exercise Comments:   Exercise Comments     Row Name 08/08/21 1337           Exercise Comments First full day of exercise!  Patient was oriented to gym and equipment including functions, settings, policies, and procedures.  Patient's individual exercise prescription and treatment plan were reviewed.  All starting workloads were established based on the results of the 6 minute walk test done at initial orientation visit.  The plan for exercise  progression was also introduced and progression will be customized based on patient's performance and goals.                Exercise Goals and Review:   Exercise Goals  [place] Name 08/06/21 1444             Exercise Goals   Increase Physical Activity Yes       Intervention Provide advice, education, support and counseling about physical activity/exercise needs.;Develop an individualized exercise prescription for aerobic and resistive training based on initial evaluation findings, risk stratification, comorbidities and participant's personal goals.       Expected Outcomes Short Term: Attend rehab on a regular basis to increase amount of physical activity.;Long Term: Add in home exercise to make exercise part of routine and to increase amount of physical activity.;Long Term: Exercising regularly at least 3-5 days a week.       Increase Strength and Stamina Yes       Intervention Provide advice, education, support and counseling about physical activity/exercise needs.;Develop an individualized exercise prescription for aerobic and resistive training based on initial evaluation findings, risk stratification, comorbidities and participant's personal goals.       Expected Outcomes Short Term: Increase workloads from initial exercise prescription for resistance, speed, and METs.;Short Term: Perform resistance training exercises routinely during rehab and add in resistance training at home;Long Term: Improve cardiorespiratory fitness, muscular endurance and strength as measured by increased METs and functional capacity (6MWT)       Able to understand and use rate of perceived exertion (RPE) scale Yes       Intervention Provide education and explanation on how to use RPE scale       Expected Outcomes Short Term: Able to use RPE daily in rehab to express subjective intensity level;Long Term:  Able to use RPE to guide intensity level when exercising independently       Able to understand and use  Dyspnea scale Yes       Intervention Provide education and explanation on how to use Dyspnea scale       Expected Outcomes Short Term: Able to use Dyspnea scale daily in rehab to express subjective sense of shortness of breath during exertion;Long Term: Able to use Dyspnea scale to guide intensity level when exercising independently       Knowledge and understanding of Target Heart Rate Range (THRR) Yes       Intervention Provide education and explanation of THRR including how the numbers were predicted and where they are located for reference       Expected Outcomes Short Term: Able to state/look up THRR;Long Term: Able to use THRR to govern intensity when exercising independently;Short Term: Able to use daily as guideline for intensity in rehab       Able to check pulse independently Yes       Intervention Provide education and demonstration on how to check pulse in carotid and radial arteries.;Review the importance of being able to check your own pulse for safety during independent exercise       Expected Outcomes Short Term: Able to explain why pulse checking is important during independent exercise;Long Term: Able to check pulse independently and accurately       Understanding of Exercise Prescription Yes       Intervention Provide education, explanation, and written materials on patient's individual exercise prescription       Expected Outcomes Short Term: Able to explain program exercise prescription;Long Term: Able to explain home exercise prescription to exercise independently                Exercise Goals Re-Evaluation :  Exercise Goals Re-Evaluation     Row Name 08/08/21 1338 08/16/21 1346  08/29/21 1623 09/26/21 1211       Exercise Goal Re-Evaluation   Exercise Goals Review Increase Physical Activity;Able to understand and use rate of perceived exertion (RPE) scale;Knowledge and understanding of Target Heart Rate Range (THRR);Understanding of Exercise Prescription;Increase  Strength and Stamina;Able to understand and use Dyspnea scale;Able to check pulse independently Increase Physical Activity;Increase Strength and Stamina;Knowledge and understanding of Target Heart Rate Range (THRR);Able to understand and use rate of perceived exertion (RPE) scale;Able to understand and use Dyspnea scale;Able to check pulse independently;Understanding of Exercise Prescription Increase Physical Activity;Increase Strength and Stamina;Understanding of Exercise Prescription Increase Physical Activity;Increase Strength and Stamina;Understanding of Exercise Prescription    Comments Reviewed RPE and dyspnea scales, THR and program prescription with pt today.  Pt voiced understanding and was given a copy of goals to take home. Reviewed home exercise with pt today.  Pt plans to walk and use elliptical at home for exercise.  We talked about using our staff videos as well.  Reviewed THR, pulse, RPE, sign and symptoms, pulse oximetery and when to call 911 or MD.  Also discussed weather considerations and indoor options.  Pt voiced understanding. Ankush is doing well for the first couple of weeks that he has been at rehab. He has already increased his workload on the treadmill to a 4% incline. He also increased to level 4 on the XR and is now using 5 lbs for handweights. He is hitting his THR during exercise. We will continue to monitor. Tashaun continues to do well in rehab. He stays compliant with the program and is already due for his post 6MWT in the next week, which he hope to see significant improvement. He tried out the track for the first time and was able to complete 44 laps. He increased his level on the T4 Nustep back to 4. His TM workloads vary a little bit and would like to see that a little more consistent. Will continue to monitor.    Expected Outcomes Short: Use RPE daily to regulate intensity. Long: Follow program prescription in THR. Short: Start to add in exercise at home Long: Continue to improve  stamina. Short: Continue to increase workload on treadmill Long: Continue to increase overall MET level Short: Improve on post 6MWT Long: Continue to build up overall strength and stamina             Discharge Exercise Prescription (Final Exercise Prescription Changes):  Exercise Prescription Changes - 09/26/21 1200       Response to Exercise   Blood Pressure (Admit) 134/72    Blood Pressure (Exit) 126/70    Heart Rate (Admit) 65 bpm    Heart Rate (Exercise) 109 bpm    Heart Rate (Exit) 85 bpm    Rating of Perceived Exertion (Exercise) 13    Symptoms none    Duration Continue with 30 min of aerobic exercise without signs/symptoms of physical distress.    Intensity THRR unchanged      Progression   Progression Continue to progress workloads to maintain intensity without signs/symptoms of physical distress.    Average METs 3.94      Resistance Training   Training Prescription Yes    Weight 6 lb    Reps 10-15      Interval Training   Interval Training No      Treadmill   MPH 3    Grade 3    Minutes 15    METs 4.54      NuStep   Level  4    Minutes 15    METs 2.5      Track   Laps 44    Minutes 15    METs 3.39      Home Exercise Plan   Plans to continue exercise at Home (comment)   walking   Frequency Add 2 additional days to program exercise sessions.    Initial Home Exercises Provided 08/16/21      Oxygen   Maintain Oxygen Saturation 88% or higher             Nutrition:  Target Goals: Understanding of nutrition guidelines, daily intake of sodium <1536m, cholesterol <2045m calories 30% from fat and 7% or less from saturated fats, daily to have 5 or more servings of fruits and vegetables.  Education: All About Nutrition: -Group instruction provided by verbal, written material, interactive activities, discussions, models, and posters to present general guidelines for heart healthy nutrition including fat, fiber, MyPlate, the role of sodium in heart  healthy nutrition, utilization of the nutrition label, and utilization of this knowledge for meal planning. Follow up email sent as well. Written material given at graduation. [facility]  Education need identified 08/06/21  Date 09/05/21  Educator [place]den IsleInstruction Review Code 1- Verbalizes Understanding       Biometrics:  Pre Biometrics - 08/06/21 1445       Pre Biometrics   Height _0  (1.803 m)    Weight 247 lb 4.8 oz (112.2 kg)    BMI (Calculated) 34.51    Single Leg Stand 30 seconds              Nutrition Therapy Plan and Nutrition Goals:  Nutrition Therapy & Goals - 09/04/21 1401       Nutrition Therapy   Diet Heart healthy, T2DM MNT    Drug/Food Interactions Statins/Certain Fruits    Protein (specify units) 90g    Fiber 30 grams    Whole Grain Foods 3 servings    Saturated Fats 16 max. grams    Fruits and Vegetables 8 servings/day    Sodium 2 grams      Personal Nutrition Goals   Nutrition Goal ST: include additional variety in foods LT: continue with current changes    Comments [age].o. M admitted to rehab status post coronary artery stent placement. PMHx includes CAD, HLD, STEMI (2011), emphysema, HTN, T2DM. Relevant medications includes lipitor, vit D, farxiga, hydrochlorothiazide, metformin, protonix, vit C, zinc. PYP Score: 69. Vegetables & Fruits 7/12. Breads, Grains & Cereals 6/12. Red & Processed Meat 10/12. Poultry 2/2. Fish & Shellfish 2/4. Beans, Nuts & Seeds 1/4. Milk & Dairy Foods 5/6. Toppings, Oils, Seasonings & Salt 16/20. Sweets, Snacks & Restaurant Food 11/14. Beverages 9/10. Last A1C ~6.4/6.5. Drinks: water, sometimes sweet tea, sometimes body armor - sugar-free, . He loves roasting food especially vegetables like brussels sprouts. He grows herbs in the summer time. B: keto sausage balls once in a while or egg cups with vegetables or a bagel thins (whole grain)  L: roasted chicken,  butternut squash, onions, beets, baby carrots. S: blackberries, strawberries, blueberries and raspberries with some monk fruit D: salmon, chicken, sweet potatoes, broccoli, salads, beans. Usually uses unsalted butter or olive oil, He will use a pinch of salt to cooking when he does, but usually uses pepper. Reviewed heart healthy and T2DM friendly eating - DaSchylers coming to nutrition education tomorrow.      Intervention  Plan   Intervention Prescribe, educate and counsel regarding individualized specific dietary modifications aiming towards targeted core components such as weight, hypertension, lipid management, diabetes, heart failure and other comorbidities.    Expected Outcomes Short Term Goal: Understand basic principles of dietary content, such as calories, fat, sodium, cholesterol and nutrients.;Short Term Goal: A plan has been developed with personal nutrition goals set during dietitian appointment.;Long Term Goal: Adherence to prescribed nutrition plan.             Nutrition Assessments:  MEDIFICTS Score Key: ?70 Need to make dietary changes  40-70 Heart Healthy Diet ? 40 Therapeutic Level Cholesterol Diet  Flowsheet Row Cardiac Rehab from 08/06/2021 in [facility] Cardiac and Pulmonary Rehab  Picture Your Plate Total Score on Admission 69      Picture Your Plate Scores: <49 Unhealthy dietary pattern with much room for improvement. 41-50 Dietary pattern unlikely to meet recommendations for good health and room for improvement. 51-60 More healthful dietary pattern, with some room for improvement.  >60 Healthy dietary pattern, although there may be some specific behaviors that could be improved.    Nutrition Goals Re-Evaluation:   Nutrition Goals Discharge (Final Nutrition Goals Re-Evaluation):   Psychosocial: Target Goals: Acknowledge presence or absence of significant depression and/or stress, maximize coping skills, provide positive support system. Participant is able to  verbalize types and ability to use techniques and skills needed for reducing stress and depression.   Education: Stress, Anxiety, and Depression - Group verbal and visual presentation to define topics covered.  Reviews how body is impacted by stress, anxiety, and depression.  Also discusses healthy ways to reduce stress and to treat/manage anxiety and depression.  Written material given at graduation.   Education: Sleep Hygiene -Provides group verbal and written instruction about how sleep can affect your health.  Define sleep hygiene, discuss sleep cycles and impact of sleep habits. Review good sleep hygiene tips.    Initial Review & Psychosocial Screening:  Initial Psych Review & Screening - 07/27/21 1012       Initial Review   Current issues with None Identified      Family Dynamics   Good Support System? Yes   Wife and children.     Barriers   Psychosocial barriers to participate in program There are no identifiable barriers or psychosocial needs.;The patient should benefit from training in stress management and relaxation.      Screening Interventions   Interventions Encouraged to exercise;To provide support and resources with identified psychosocial needs;Provide feedback about the scores to participant    Expected Outcomes Short Term goal: Utilizing psychosocial counselor, staff and physician to assist with identification of specific Stressors or current issues interfering with healing process. Setting desired goal for each stressor or current issue identified.;Long Term Goal: Stressors or current issues are controlled or eliminated.;Short Term goal: Identification and review with participant of any Quality of Life or Depression concerns found by scoring the questionnaire.;Long Term goal: The participant improves quality of Life and PHQ9 Scores as seen by post scores and/or verbalization of changes             Quality of Life Scores:   Quality of Life - 08/06/21 1445        Quality of Life   Select Quality of Life      Quality of Life Scores   Health/Function Pre 26.7 %    Socioeconomic Pre 30 %    Psych/Spiritual Pre 28.07 %    Family Pre 28.8 %  GLOBAL Pre 28.03 %            Scores of 19 and below usually indicate a poorer quality of life in these areas.  A difference of  2-3 points is a clinically meaningful difference.  A difference of 2-3 points in the total score of the Quality of Life Index has been associated with significant improvement in overall quality of life, self-image, physical symptoms, and general health in studies assessing change in quality of life.  PHQ-9: Review Flowsheet       08/06/2021  Depression screen PHQ 2/9  Decreased Interest 0  Down, Depressed, Hopeless 0  PHQ - 2 Score 0  Altered sleeping 0  Tired, decreased energy 0  Change in appetite 0  Feeling bad or failure about yourself  0  Trouble concentrating 0  Moving slowly or fidgety/restless 0  Suicidal thoughts 0  PHQ-9 Score 0  Difficult doing work/chores Not difficult at all   Interpretation of Total Score  Total Score Depression Severity:  1-4 = Minimal depression, 5-9 = Mild depression, 10-14 = Moderate depression, 15-19 = Moderately severe depression, 20-27 = Severe depression   Psychosocial Evaluation and Intervention:  Psychosocial Evaluation - 07/27/21 1027       Psychosocial Evaluation & Interventions   Interventions Encouraged to exercise with the program and follow exercise prescription    Comments Joby has no barriers to attending the program. He lives with his wiife and their 2 dogs. His children live within 10 miles and arepart ofhis support along with his wife.  He has had a heart attack in the past. He is ready to start the program and get back to his daily routines and back to his fishing trips. He should do well.    Expected Outcomes STG Shubh attends all scheduled sessions. He progresses to be able to return to his hobbies and daily life.  LTG Deigo will continue to progress his exercise and ability to continue his ADL and hobbies    Continue Psychosocial Services  Follow up required by staff             Psychosocial Re-Evaluation:  Psychosocial Re-Evaluation     [place] Name 09/06/21 1342             Psychosocial Re-Evaluation   Current issues with None Identified       Comments Patient reports no issues with their current mental states, sleep, stress, depression or anxiety. Will follow up with patient in a few weeks for any changes.       Expected Outcomes Short: Continue to exercise regularly to support mental health and notify staff of any changes. Long: maintain mental health and well being through teaching of rehab or prescribed medications independently.       Interventions Encouraged to attend Cardiac Rehabilitation for the exercise       Continue Psychosocial Services  Follow up required by staff                Psychosocial Discharge (Final Psychosocial Re-Evaluation):  Psychosocial Re-Evaluation - 09/06/21 1342       Psychosocial Re-Evaluation   Current issues with None Identified    Comments Patient reports no issues with their current mental states, sleep, stress, depression or anxiety. Will follow up with patient in a few weeks for any changes.    Expected Outcomes Short: Continue to exercise regularly to support mental health and notify staff of any changes. Long: maintain mental health and well being through teaching  of rehab or prescribed medications independently.    Interventions Encouraged to attend Cardiac Rehabilitation for the exercise    Continue Psychosocial Services  Follow up required by staff             Vocational Rehabilitation: Provide vocational rehab assistance to qualifying candidates.   Vocational Rehab Evaluation & Intervention:   Education: Education Goals: Education classes will be provided on a variety of topics geared toward better understanding of heart health  and risk factor modification. Participant will state understanding/return demonstration of topics presented as noted by education test scores.  Learning Barriers/Preferences:  Learning Barriers/Preferences - 07/27/21 1014       Learning Barriers/Preferences   Learning Barriers None    Learning Preferences None             General Cardiac Education Topics:  AED/CPR: - Group verbal and written instruction with the use of models to demonstrate the basic use of the AED with the basic ABC's of resuscitation.   Anatomy and Cardiac Procedures: - Group verbal and visual presentation and models provide information about basic cardiac anatomy and function. Reviews the testing methods done to diagnose heart disease and the outcomes of the test results. Describes the treatment choices: Medical Management, Angioplasty, or Coronary Bypass Surgery for treating various heart conditions including Myocardial Infarction, Angina, Valve Disease, and Cardiac Arrhythmias.  Written material given at graduation. Flowsheet Row Cardiac Rehab from 09/19/2021 in [facility] Cardiac and Pulmonary Rehab  Date 08/22/21  Educator SB  Instruction Review Code 1- Verbalizes Understanding       Medication Safety: - Group verbal and visual instruction to review commonly prescribed medications for heart and lung disease. Reviews the medication, class of the drug, and side effects. Includes the steps to properly store meds and maintain the prescription regimen.  Written material given at graduation. Flowsheet Row Cardiac Rehab from 09/19/2021 in [facility] Cardiac and Pulmonary Rehab  Date 09/12/21  Educator KB  Instruction Review Code 1- Verbalizes Understanding       Intimacy: - Group verbal instruction through game format to discuss how heart and lung disease can affect sexual intimacy. Written material given at graduation.. Flowsheet Row Cardiac Rehab from 09/19/2021 in [facility] Cardiac and Pulmonary Rehab  Date 08/15/21   Educator [place]  Instruction Review Code 1- Verbalizes Understanding       Know Your Numbers and Heart Failure: - Group verbal and visual instruction to discuss disease risk factors for cardiac and pulmonary disease and treatment options.  Reviews associated critical values for Overweight/Obesity, Hypertension, Cholesterol, and Diabetes.  Discusses basics of heart failure: signs/symptoms and treatments.  Introduces Heart Failure Zone chart for action plan for heart failure.  Written material given at graduation. Flowsheet Row Cardiac Rehab from 09/19/2021 in [facility] Cardiac and Pulmonary Rehab  Date 09/19/21  Educator SB  Instruction Review Code 1- Verbalizes Understanding       Infection Prevention: - Provides verbal and written material to individual with discussion of infection control including proper hand washing and proper equipment cleaning during exercise session. Flowsheet Row Cardiac Rehab from 09/19/2021 in [facility] Cardiac and Pulmonary Rehab  Date 08/06/21  Educator [facility]  Instruction Review Code 1- Verbalizes Understanding       Falls Prevention: - Provides verbal and written material to individual with discussion of falls prevention and safety. Flowsheet Row Cardiac Rehab from 09/19/2021 in [facility] Cardiac and Pulmonary Rehab  Date 08/06/21  Educator [place] [facility]  Instruction Review Code 1- [employer]  Other: -Provides group and verbal instruction on various topics (see comments)   Knowledge Questionnaire Score:  Knowledge Questionnaire Score - 08/06/21 1446       Knowledge Questionnaire Score   Pre Score 25/26             Core Components/Risk Factors/Patient Goals at Admission:  Personal Goals and Risk Factors at Admission - 08/06/21 1447       Core Components/Risk Factors/Patient Goals on Admission    Weight Management Yes    Intervention Weight Management: Develop a combined nutrition and exercise program designed to reach desired caloric intake, while  maintaining appropriate intake of nutrient and fiber, sodium and fats, and appropriate energy expenditure required for the weight goal.;Weight Management: Provide education and appropriate resources to help participant work on and attain dietary goals.;Weight Management/Obesity: Establish reasonable short term and long term weight goals.;Obesity: Provide education and appropriate resources to help participant work on and attain dietary goals.    Admit Weight 247 lb 4.8 oz (112.2 kg)    Goal Weight: Short Term 240 lb (108.9 kg)    Goal Weight: Long Term 210 lb (95.3 kg)    Expected Outcomes Short Term: Continue to assess and modify interventions until short term weight is achieved;Long Term: Adherence to nutrition and physical activity/exercise program aimed toward attainment of established weight goal;Weight Loss: Understanding of general recommendations for a balanced deficit meal plan, which promotes 1-2 lb weight loss per week and includes a negative energy balance of [phone] kcal/d;Understanding of distribution of calorie intake throughout the day with the consumption of 4-5 meals/snacks;Understanding recommendations for meals to include 15-35% energy as protein, 25-35% energy from fat, 35-60% energy from carbohydrates, less than 225m of dietary cholesterol, 20-35 gm of total fiber daily    Diabetes Yes    Intervention Provide education about signs/symptoms and action to take for hypo/hyperglycemia.;Provide education about proper nutrition, including hydration, and aerobic/resistive exercise prescription along with prescribed medications to achieve blood glucose in normal ranges: Fasting glucose 65-99 mg/dL    Expected Outcomes Short Term: Participant verbalizes understanding of the signs/symptoms and immediate care of hyper/hypoglycemia, proper foot care and importance of medication, aerobic/resistive exercise and nutrition plan for blood glucose control.;Long Term: Attainment of HbA1C < 7%.    Heart  Failure Yes    Intervention Provide a combined exercise and nutrition program that is supplemented with education, support and counseling about heart failure. Directed toward relieving symptoms such as shortness of breath, decreased exercise tolerance, and extremity edema.    Expected Outcomes Improve functional capacity of life;Short term: Attendance in program 2-3 days a week with increased exercise capacity. Reported lower sodium intake. Reported increased fruit and vegetable intake. Reports medication compliance.;Short term: Daily weights obtained and reported for increase. Utilizing diuretic protocols set by physician.;Long term: Adoption of self-care skills and reduction of barriers for early signs and symptoms recognition and intervention leading to self-care maintenance.    Hypertension Yes    Intervention Provide education on lifestyle modifcations including regular physical activity/exercise, weight management, moderate sodium restriction and increased consumption of fresh fruit, vegetables, and low fat dairy, alcohol moderation, and smoking cessation.;Monitor prescription use compliance.    Expected Outcomes Short Term: Continued assessment and intervention until BP is < 140/968mHG in hypertensive participants. < 130/8026mG in hypertensive participants with diabetes, heart failure or chronic kidney disease.;Long Term: Maintenance of blood pressure at goal levels.    Lipids Yes    Intervention Provide education and support for participant on nutrition &  aerobic/resistive exercise along with prescribed medications to achieve LDL <15m, HDL >462m    Expected Outcomes Short Term: Participant states understanding of desired cholesterol values and is compliant with medications prescribed. Participant is following exercise prescription and nutrition guidelines.;Long Term: Cholesterol controlled with medications as prescribed, with individualized exercise RX and with personalized nutrition plan. Value  goals: LDL < 7048mHDL > 40 mg.             Education:Diabetes - Individual verbal and written instruction to review signs/symptoms of diabetes, desired ranges of glucose level fasting, after meals and with exercise. Acknowledge that pre and post exercise glucose checks will be done for 3 sessions at entry of program.   Core Components/Risk Factors/Patient Goals Review:   Goals and Risk Factor Review     Row Name 09/06/21 1337             Core Components/Risk Factors/Patient Goals Review   Personal Goals Review Weight Management/Obesity       Review [place] that he does not eat to much but  has not lost weight. He knows he had a bit to eat last weekend. Sometimes he may not eat much, but maybe another time he might have a bigger meal. Dales blood pressure has been doing well and sometimes low. His Olmesartin medication has been cut back from his doctor but is doing well. He states he is not getting dizzy when he is up and moving around. He is checking his blood pressure at home.       Expected Outcomes Short: lose 5 pounds in the next couple weeks. Long: reach his weight goal of 210lbs.                Core Components/Risk Factors/Patient Goals at Discharge (Final Review):   Goals and Risk Factor Review - 09/06/21 1337       Core Components/Risk Factors/Patient Goals Review   Personal Goals Review Weight Management/Obesity    Review Dal[place]ates that he does not eat to much but  has not lost weight. He knows he had a bit to eat last weekend. Sometimes he may not eat much, but maybe another time he might have a bigger meal. Dales blood pressure has been doing well and sometimes low. His Olmesartin medication has been cut back from his doctor but is doing well. He states he is not getting dizzy when he is up and moving around. He is checking his blood pressure at home.    Expected Outcomes Short: lose 5 pounds in the next couple weeks. Long: reach his weight goal of 210lbs.              ITP Comments:  ITP Comments     Row Name 07/27/21 1022 08/06/21 1438 08/08/21 1337 08/29/21 0912 09/04/21 1443   ITP Comments Virtual orientation call completed today. he has an appointment on Date: 08/06/2021  for EP eval and gym Orientation.  [facility] . Completed 6MWT and gym orientation. Initial ITP created and sent for review to Dr. MarEmily Filbertedical Director. First full day of exercise!  Patient was oriented to gym and equipment including functions, settings, policies, and procedures.  Patient's individual exercise prescription and treatment plan were reviewed.  All starting workloads were established based on the results of the 6 minute walk test done at initial orientation visit.  The plan for exercise progression was also introduced and progression will be customized based on patient's  performance and goals. 30 Day review completed. Medical Director ITP review done, changes made as directed, and signed approval by Medical Director. Completed initial RD consultation    [place] Name 09/26/21 1344           ITP Comments 30 Day review completed. Medical Director ITP review done, changes made as directed, and signed approval by Medical Director.                Comments:

## 2021-09-26 NOTE — Progress Notes (Signed)
Daily Session Note  Patient Details  Name: Adam Hunter MRN: [id number] Date of Birth: 05-07-1960 Referring Provider:   Flowsheet Row Cardiac Rehab from 08/06/2021 in [facility] Cardiac and Pulmonary Rehab  Referring Provider Dr. Sallyanne Kuster       Encounter Date: 09/26/2021  Check In:  Session Check In - 09/26/21 1423       Check-In   Supervising physician immediately available to respond to emergencies See telemetry face sheet for immediately available ER MD    Location [facility]-Cardiac & Pulmonary Rehab    Staff Present Heath Lark, RN, BSN, CCRP;Melissa Leola, RDN, Tawanna Solo, MS, ASCM CEP, Exercise Physiologist    Virtual Visit No    Medication changes reported     No    Fall or balance concerns reported    No    Warm-up and Cool-down Performed on first and last piece of equipment    Resistance Training Performed Yes    VAD Patient? No    PAD/SET Patient? No      Pain Assessment   Currently in Pain? No/denies                Social History   Tobacco Use  Smoking Status Former   Packs/day: 1.50   Years: 30.00   Total pack years: 45.00   Types: Cigarettes   Quit date: 01/22/2010   Years since quitting: 11.6  Smokeless Tobacco Former    Goals Met:  Independence with exercise equipment Exercise tolerated well No report of concerns or symptoms today  Goals Unmet:  Not Applicable  Comments: Pt able to follow exercise prescription today without complaint.  Will continue to monitor for progression.    Dr. Emily Filbert is Medical Director for [place].  Dr. Ottie Glazier is Medical Director for [facility] Pulmonary Rehabilitation.

## 2021-09-27 DIAGNOSIS — Z955 Presence of coronary angioplasty implant and graft: Secondary | ICD-10-CM

## 2021-09-27 NOTE — Progress Notes (Signed)
Daily Session Note  Patient Details  Name: Adam Hunter MRN: [id number] Date of Birth: [birth year] Referring Provider:   Flowsheet Row Cardiac Rehab from 08/06/2021 in [facility] Cardiac and Pulmonary Rehab  Referring Provider Dr. Sallyanne Kuster       Encounter Date: 09/27/2021  Check In:  Session Check In - 09/27/21 1336       Check-In   Supervising physician immediately available to respond to emergencies See telemetry face sheet for immediately available ER MD    Location [facility]-Cardiac & Pulmonary Rehab    Staff Present Vida Rigger, RN, BSN;Joseph [place], RCP,RRT,BSRT;Jessica [place], [place], RCEP, CCRP, CCET    Virtual Visit No    Medication changes reported     No    Fall or balance concerns reported    No    Tobacco Cessation No Change    Warm-up and Cool-down Performed on first and last piece of equipment    Resistance Training Performed Yes    VAD Patient? No    PAD/SET Patient? No      Pain Assessment   Currently in Pain? No/denies                Social History   Tobacco Use  Smoking Status Former   Packs/day: 1.50   Years: 30.00   Total pack years: 45.00   Types: Cigarettes   Quit date: 01/22/2010   Years since quitting: 11.6  Smokeless Tobacco Former    Goals Met:  Independence with exercise equipment Exercise tolerated well No report of concerns or symptoms today Strength training completed today  Goals Unmet:  Not Applicable  Comments: Pt able to follow exercise prescription today without complaint.  Will continue to monitor for progression.   Dr. Emily Filbert is Medical Director for [place].  Dr. Ottie Glazier is Medical Director for [facility] Pulmonary Rehabilitation.

## 2021-10-01 VITALS — Ht 71.0 in | Wt 248.3 lb

## 2021-10-01 DIAGNOSIS — Z955 Presence of coronary angioplasty implant and graft: Secondary | ICD-10-CM

## 2021-10-01 NOTE — Progress Notes (Signed)
Daily Session Note  Patient Details  Name: Adam Hunter MRN: [id number] Date of Birth: 1961/03/10 Referring Provider:   Flowsheet Row Cardiac Rehab from 08/06/2021 in [facility] Cardiac and Pulmonary Rehab  Referring Provider Dr. Sallyanne Kuster       Encounter Date: 10/01/2021  Check In:  Session Check In - 10/01/21 1330       Check-In   Supervising physician immediately available to respond to emergencies See telemetry face sheet for immediately available ER MD    Location [facility]-Cardiac & Pulmonary Rehab    Staff Present Earlean Shawl, BS, ACSM CEP, Exercise Physiologist;Heer Justiss Rosalia Hammers, MPA, RN;Joseph Tessie Fass, RCP,RRT,BSRT    Virtual Visit No    Medication changes reported     No    Fall or balance concerns reported    No    Tobacco Cessation No Change    Warm-up and Cool-down Performed on first and last piece of equipment    Resistance Training Performed Yes    VAD Patient? No    PAD/SET Patient? No      Pain Assessment   Currently in Pain? No/denies                Social History   Tobacco Use  Smoking Status Former   Packs/day: 1.50   Years: 30.00   Total pack years: 45.00   Types: Cigarettes   Quit date: 01/22/2010   Years since quitting: 11.6  Smokeless Tobacco Former    Goals Met:  Independence with exercise equipment Exercise tolerated well No report of concerns or symptoms today Strength training completed today  Goals Unmet:  Not Applicable  Comments: Pt able to follow exercise prescription today without complaint.  Will continue to monitor for progression.    Dr. Emily Filbert is Medical Director for [place].  Dr. Ottie Glazier is Medical Director for [facility] Pulmonary Rehabilitation.

## 2021-10-03 DIAGNOSIS — Z955 Presence of coronary angioplasty implant and graft: Secondary | ICD-10-CM

## 2021-10-03 NOTE — Progress Notes (Signed)
Daily Session Note  Patient Details  Name: Adam Hunter MRN: [id number] Date of Birth: 11-17-1960 Referring Provider:   Flowsheet Row Cardiac Rehab from 08/06/2021 in [facility] Cardiac and Pulmonary Rehab  Referring Provider Dr. Sallyanne Kuster       Encounter Date: 10/03/2021  Check In:  Session Check In - 10/03/21 1320       Check-In   Supervising physician immediately available to respond to emergencies See telemetry face sheet for immediately available ER MD    Location [facility]-Cardiac & Pulmonary Rehab    Staff Present Justin Mend, Fabio Neighbors, MPA, RN    Virtual Visit No    Medication changes reported     No    Fall or balance concerns reported    No    Tobacco Cessation No Change    Warm-up and Cool-down Performed on first and last piece of equipment    Resistance Training Performed Yes    VAD Patient? No    PAD/SET Patient? No      Pain Assessment   Currently in Pain? No/denies                Social History   Tobacco Use  Smoking Status Former   Packs/day: 1.50   Years: 30.00   Total pack years: 45.00   Types: Cigarettes   Quit date: 01/22/2010   Years since quitting: 11.7  Smokeless Tobacco Former    Goals Met:  Independence with exercise equipment Exercise tolerated well No report of concerns or symptoms today Strength training completed today  Goals Unmet:  Not Applicable  Comments: Pt able to follow exercise prescription today without complaint.  Will continue to monitor for progression.    Dr. Emily Filbert is Medical Director for [place].  Dr. Ottie Glazier is Medical Director for [facility] Pulmonary Rehabilitation.

## 2021-10-04 ENCOUNTER — Encounter: Payer: BC Managed Care – PPO | Admitting: *Deleted

## 2021-10-04 DIAGNOSIS — Z955 Presence of coronary angioplasty implant and graft: Secondary | ICD-10-CM

## 2021-10-04 NOTE — Progress Notes (Signed)
Daily Session Note  Patient Details  Name: Adam Hunter MRN: [id number] Date of Birth: Apr 29, 1960 Referring Provider:   Flowsheet Row Cardiac Rehab from 08/06/2021 in [facility] Cardiac [facility] Pul[facility] Rehab  Referring Provider Dr. Sallyanne Kuster       Encounter Date: 10/04/2021  Check In:  Session Check In - 10/04/21 1406       Check-In   Supervising physician immediately available to respond to emergencies See telemetry face sheet for immediately available ER MD    Location [facility]-Cardiac & Pulmonary Rehab    Staff Present Heath Lark, RN, BSN, CCRP;Kristen Coble, RN,BC,MSN;Joseph Din[place], [place]    Virtual Visit No    Medication changes reported     No    Fall or balance concerns reported    No    Warm-up and Cool-down Performed on first and last piece of equipment    Resistance Training Performed Yes    VAD Patient? No    PAD/SET Patient? No      Pain Assessment   Currently in Pain? No/denies                Social History   Tobacco Use  Smoking Status Former   Packs/day: 1.50   Years: 30.00   Total pack years: 45.00   Types: Cigarettes   Quit date: 01/22/2010   Years since quitting: 11.7  Smokeless Tobacco Former    Goals Met:  Independence with exercise equipment Exercise tolerated well No report of concerns or symptoms today  Goals Unmet:  Not Applicable  Comments: Pt able to follow exercise prescription today without complaint.  Will continue to monitor for progression.    Dr. Emily Filbert is Medical Director for [place].  Dr. Ottie Glazier is Medical Director for [facility] Pulmonary Rehabilitation.

## 2021-10-08 DIAGNOSIS — Z955 Presence of coronary angioplasty implant and graft: Secondary | ICD-10-CM | POA: Diagnosis not present

## 2021-10-10 DIAGNOSIS — Z955 Presence of coronary angioplasty implant and graft: Secondary | ICD-10-CM

## 2021-10-10 NOTE — Progress Notes (Signed)
Daily Session Note  Patient Details  Name: STEFFON GLADU MRN: [id number] Date of Birth: 05-19-1960 Referring Provider:   Flowsheet Row Cardiac Rehab from 08/06/2021 in [facility] Cardiac and Pulmonary Rehab  Referring Provider Dr. Sallyanne Kuster       Encounter Date: 10/10/2021  Check In:  Session Check In - 10/10/21 1349       Check-In   Supervising physician immediately available to respond to emergencies See telemetry face sheet for immediately available ER MD    Location [facility]-Cardiac & Pulmonary Rehab    Staff Present Antionette Fairy, BS, Exercise Physiologist;Joseph [place], RCP,RRT,BSRT;Mckaela Howley Gentry, MPA, Nino Glow, MS, ASCM CEP, Exercise Physiologist    Virtual Visit No    Medication changes reported     No    Fall or balance concerns reported    No    Tobacco Cessation No Change    Warm-up and Cool-down Performed on first and last piece of equipment    Resistance Training Performed Yes    VAD Patient? No    PAD/SET Patient? No      Pain Assessment   Currently in Pain? No/denies                Social History   Tobacco Use  Smoking Status Former   Packs/day: 1.50   Years: 30.00   Total pack years: 45.00   Types: Cigarettes   Quit date: 01/22/2010   Years since quitting: 11.7  Smokeless Tobacco Former    Goals Met:  Independence with exercise equipment Exercise tolerated well No report of concerns or symptoms today Strength training completed today  Goals Unmet:  Not Applicable  Comments:  Terran graduated today from  rehab with 36 sessions completed.  Details of the patient's exercise prescription and what He needs to do in order to continue the prescription and progress were discussed with patient.  Patient was given a copy of prescription and goals.  Patient verbalized understanding.  Quita Skye plans to continue to exercise by walking at home.    Dr. Emily Filbert is Medical Director for [place].  Dr. Ottie Glazier is Medical Director  for [facility] Pulmonary Rehabilitation.

## 2021-10-10 NOTE — Progress Notes (Signed)
Discharge Report Referring Provider: Windle Guard, MD  Adam Hunter graduated today from  rehab with 36 sessions completed.  Details of the patient's exercise prescription and what He needs to do in order to continue the prescription and progress were discussed with patient.  Patient was given a copy of prescription and goals.  Patient verbalized understanding.  Adam Hunter plans to continue to exercise by walking at home.   6 Minute Walk     Row Name 08/06/21 1438 10/01/21 1527       6 Minute Walk   Phase Initial Discharge    Distance 1435 feet 1565 feet    Distance % Change -- 9 %    Distance Feet Change -- 130 ft    Walk Time 6 minutes 6 minutes    # of Rest Breaks 0 0    MPH 2.72 2.96    METS 3.46 3.44    RPE 10 10    Perceived Dyspnea  0 0    VO2 Peak 12.12 12.04    Symptoms No No    Resting HR 85 bpm 64 bpm    Resting BP 122/82 98/60    Resting Oxygen Saturation  96 % 95 %    Exercise Oxygen Saturation  during 6 min walk 96 % 92 %    Max Ex. HR 109 bpm 102 bpm    Max Ex. BP 142/80 120/62    2 Minute Post BP 114/72 --            Thank you for the referral!

## 2021-10-10 NOTE — Progress Notes (Signed)
Cardiac Individual Treatment Plan  Patient Details  Name: Adam Hunter MRN: [id number] Date of Birth: [birth year] Referring Provider:   Flowsheet Row Cardiac Rehab from 08/06/2021 in [facility] Cardiac and Pulmonary Rehab  Referring Provider Dr. Sallyanne Kuster       Initial Encounter Date:  Flowsheet Row Cardiac Rehab from 08/06/2021 in [facility] Cardiac and Pulmonary Rehab  Date 08/06/21       Visit Diagnosis: Status post coronary artery stent placement  Patient's Home Medications on Admission:  Current Outpatient Medications:    Ascorbic Acid (VITAMIN C) 1000 MG tablet, Take 1,000 mg by mouth daily., Disp: , Rfl:    aspirin EC 81 MG tablet, Take 1 tablet (81 mg total) by mouth daily., Disp: 90 tablet, Rfl: 3   atorvastatin (LIPITOR) 80 MG tablet, Take 1 tablet (80 mg total) by mouth daily., Disp: 90 tablet, Rfl: 3   Cholecalciferol (VITAMIN D) 50 MCG (2000 UT) tablet, Take 2,000 Units by mouth daily., Disp: , Rfl:    clopidogrel (PLAVIX) 75 MG tablet, TAKE 1 TABLET BY MOUTH DAILY, Disp: 90 tablet, Rfl: 1   dapagliflozin propanediol (FARXIGA) 10 MG TABS tablet, Take 1 tablet (10 mg total) by mouth daily before breakfast., Disp: 90 tablet, Rfl: 1   hydrochlorothiazide (HYDRODIURIL) 25 MG tablet, TAKE 1 TABLET BY MOUTH DAILY, Disp: 90 tablet, Rfl: 3   metFORMIN (GLUCOPHAGE) 500 MG tablet, Take 1 tablet (500 mg total) by mouth 2 (two) times daily., Disp: 180 tablet, Rfl: 1   metoprolol succinate (TOPROL-XL) 100 MG 24 hr tablet, TAKE 1 TABLET BY MOUTH DAILY WITH OR IMMEDIATELY FOLLOWING A MEAL, Disp: 90 tablet, Rfl: 3   nitroGLYCERIN (NITROSTAT) 0.4 MG SL tablet, Place 1 tablet (0.4 mg total) under the tongue every 5 (five) minutes as needed., Disp: 25 tablet, Rfl: 12   olmesartan (BENICAR) 20 MG tablet, Take 1 tablet (20 mg total) by mouth daily., Disp: 90 tablet, Rfl: 3   pantoprazole (PROTONIX) 40 MG tablet, Take 1 tablet (40 mg total) by mouth daily. Further refills by PCP., Disp: 30 tablet, Rfl: 1    sildenafil (VIAGRA) 50 MG tablet, Take 1 tablet (50 mg total) by mouth daily as needed for erectile dysfunction., Disp: 10 tablet, Rfl: 3   Zinc 50 MG TABS, Take 50 mg by mouth 2 (two) times a week. Wednesdays and Sundays, Disp: , Rfl:   Current Facility-Administered Medications:    sodium chloride flush (NS) 0.9 % injection 3 mL, 3 mL, Intravenous, Q12H, Croitoru, Mihai, MD  Past Medical History: Past Medical History:  Diagnosis Date   CAD (coronary artery disease) 11/25/2012   STEMI October 2011 secondary to occlusion of a large septal branch of the LAD artery treated medically   Emphysema    Heart attack ([place]) 01/2010   non-STEMI   Hyperlipidemia    Hyperlipidemia 11/25/2012   Hypertension    OSA on CPAP     Tobacco Use: Social History   Tobacco Use  Smoking Status Former   Packs/day: 1.50   Years: 30.00   Total pack years: 45.00   Types: Cigarettes   Quit date: 01/22/2010   Years since quitting: 11.7  Smokeless Tobacco Former    Labs: Review Flowsheet  More data exists      Latest Ref Rng & Units 04/22/2017 08/03/2019 09/17/2019 08/16/2020  Labs for ITP Cardiac and Pulmonary Rehab  Cholestrol 100 - 199 mg/dL 129  131  - 119   LDL (calc) 0 - 99 mg/dL 75  65  -  55   HDL-C >39 mg/dL 27  24  - 27   Trlycerides 0 - 149 mg/dL 134  257  - 228   Hemoglobin A1c 4.8 - 5.6 % - - 10.6  -      07/05/2021  Labs for ITP Cardiac and Pulmonary Rehab  Cholestrol 117   LDL (calc) 60   HDL-C 26   Trlycerides 186   Hemoglobin A1c 6.8      Exercise Target Goals: Exercise Program Goal: Individual exercise prescription set using results from initial 6 min walk test and THRR while considering  patient's activity barriers and safety.   Exercise Prescription Goal: Initial exercise prescription builds to 30-45 minutes a day of aerobic activity, 2-3 days per week.  Home exercise guidelines will be given to patient during program as part of exercise prescription that the participant will  acknowledge.   Education: Aerobic Exercise: - Group verbal and visual presentation on the components of exercise prescription. Introduces F.I.T.T principle from ACSM for exercise prescriptions.  Reviews F.I.T.T. principles of aerobic exercise including progression. Written material given at graduation. Flowsheet Row Cardiac Rehab from 10/03/2021 in [facility] Cardiac and Pulmonary Rehab  Date 08/15/21  Educator [place]  Instruction Review Code 1- Verbalizes Understanding       Education: Resistance Exercise: - Group verbal and visual presentation on the components of exercise prescription. Introduces F.I.T.T principle from ACSM for exercise prescriptions  Reviews F.I.T.T. principles of resistance exercise including progression. Written material given at graduation. Flowsheet Row Cardiac Rehab from 10/03/2021 in [facility] Cardiac and Pulmonary Rehab  Date 08/22/21  Educator [facility]  Instruction Review Code 1- Verbalizes Understanding        Education: Exercise & Equipment Safety: - Individual verbal instruction and demonstration of equipment use and safety with use of the equipment. Flowsheet Row Cardiac Rehab from 10/03/2021 in [facility] Cardiac and Pulmonary Rehab  Date 08/06/21  Educator [facility]  Instruction Review Code 1- Verbalizes Understanding       Education: Exercise Physiology & General Exercise Guidelines: - Group verbal and written instruction with models to review the exercise physiology of the cardiovascular system and associated critical values. Provides general exercise guidelines with specific guidelines to those with heart or lung disease.    Education: Flexibility, Balance, Mind/Body Relaxation: - Group verbal and visual presentation with interactive activity on the components of exercise prescription. Introduces F.I.T.T principle from ACSM for exercise prescriptions. Reviews F.I.T.T. principles of flexibility and balance exercise training including progression. Also discusses the mind body  connection.  Reviews various relaxation techniques to help reduce and manage stress (i.e. Deep breathing, progressive muscle relaxation, and visualization). Balance handout provided to take home. Written material given at graduation. Flowsheet Row Cardiac Rehab from 10/03/2021 in [facility] Cardiac and Pulmonary Rehab  Date 08/29/21  Educator [place]  Instruction Review Code 1- Verbalizes Understanding       Activity Barriers & Risk Stratification:  Activity Barriers & Cardiac Risk Stratification - 07/27/21 1012       Activity Barriers & Cardiac Risk Stratification   Activity Barriers None    Cardiac Risk Stratification Moderate             6 Minute Walk:  6 Minute Walk     Row Name 08/06/21 1438 10/01/21 1527       6 Minute Walk   Phase Initial Discharge    Distance 1435 feet 1565 feet    Distance % Change -- 9 %    Distance Feet Change -- 130 ft  Walk Time 6 minutes 6 minutes    # of Rest Breaks 0 0    MPH 2.72 2.96    METS 3.46 3.44    RPE 10 10    Perceived Dyspnea  0 0    VO2 Peak 12.12 12.04    Symptoms No No    Resting HR 85 bpm 64 bpm    Resting BP 122/82 98/60    Resting Oxygen Saturation  96 % 95 %    Exercise Oxygen Saturation  during 6 min walk 96 % 92 %    Max Ex. HR 109 bpm 102 bpm    Max Ex. BP 142/80 120/62    2 Minute Post BP 114/72 --             Oxygen Initial Assessment:   Oxygen Re-Evaluation:   Oxygen Discharge (Final Oxygen Re-Evaluation):   Initial Exercise Prescription:  Initial Exercise Prescription - 08/06/21 1400       Date of Initial Exercise RX and Referring Provider   Date 08/06/21    Referring Provider Dr. Royann Shivers      Oxygen   Maintain Oxygen Saturation 88% or higher      Treadmill   MPH 3    Grade 1    Minutes 15    METs 3.71      Recumbant Bike   Level 3    RPM 60    Minutes 15    METs 3.46      NuStep   Level 4    SPM 80    Minutes 15    METs 3.46      Elliptical   Level 1    Speed 3    Minutes  15    METs 3.46      REL-XR   Level 3    Speed 50    Minutes 15    METs 3.46      Biostep-RELP   Level 3    SPM 50    Minutes 15    METs 3.46      Prescription Details   Frequency (times per week) 3    Duration Progress to 30 minutes of continuous aerobic without signs/symptoms of physical distress      Intensity   THRR 40-80% of Max Heartrate 115-145    Ratings of Perceived Exertion 11-13    Perceived Dyspnea 0-4      Progression   Progression Continue to progress workloads to maintain intensity without signs/symptoms of physical distress.      Resistance Training   Training Prescription Yes    Weight 4    Reps 10-15             Perform Capillary Blood Glucose checks as needed.  Exercise Prescription Changes:   Exercise Prescription Changes     Row Name 08/06/21 1400 08/16/21 1300 08/16/21 1500 08/29/21 1600 09/26/21 1200     Response to Exercise   Blood Pressure (Admit) 122/82 -- 102/60 132/70 134/72   Blood Pressure (Exercise) 142/80 -- 144/70 136/70 --   Blood Pressure (Exit) 114/72 -- 102/60 124/70 126/70   Heart Rate (Admit) 85 bpm -- 92 bpm 83 bpm 65 bpm   Heart Rate (Exercise) 109 bpm -- 123 bpm 127 bpm 109 bpm   Heart Rate (Exit) 73 bpm -- 122 bpm 100 bpm 85 bpm   Oxygen Saturation (Admit) 96 % -- -- -- --   Oxygen Saturation (Exercise) 96 % -- -- -- --  Oxygen Saturation (Exit) 96 % -- -- -- --   Rating of Perceived Exertion (Exercise) 10 -- $Rem'12 14 13   'nbkO$ Perceived Dyspnea (Exercise) 0 -- -- -- --   Symptoms none -- none none none   Comments 6 MWT results -- -- -- --   Duration -- -- Continue with 30 min of aerobic exercise without signs/symptoms of physical distress. Continue with 30 min of aerobic exercise without signs/symptoms of physical distress. Continue with 30 min of aerobic exercise without signs/symptoms of physical distress.   Intensity -- -- THRR unchanged THRR unchanged THRR unchanged     Progression   Progression -- -- Continue to  progress workloads to maintain intensity without signs/symptoms of physical distress. Continue to progress workloads to maintain intensity without signs/symptoms of physical distress. Continue to progress workloads to maintain intensity without signs/symptoms of physical distress.   Average METs -- -- 2.91 3.92 3.94     Resistance Training   Training Prescription -- -- Yes Yes Yes   Weight -- -- 4 lb 5 lb 6 lb   Reps -- -- 10-15 10-15 10-15     Interval Training   Interval Training -- -- No No No     Treadmill   MPH -- -- 2.$Remov'8 3 3   'rCvCCX$ Grade -- -- $Rem'1 4 3   'UUCa$ Minutes -- -- $Rem'15 15 15   'DGHq$ METs -- -- 3.53 4.95 4.54     Recumbant Bike   Level -- -- 2 -- --   Minutes -- -- 15 -- --   METs -- -- 3.2 -- --     NuStep   Level -- -- 3 -- 4   Minutes -- -- 15 -- 15   METs -- -- 2.4 -- 2.5     Recumbant Elliptical   Level -- -- -- 3 --   Minutes -- -- -- 15 --   METs -- -- -- 2.5 --     REL-XR   Level -- -- 3 4 --   Minutes -- -- 15 15 --   METs -- -- 2.5 4.3 --     Track   Laps -- -- -- -- 44   Minutes -- -- -- -- 15   METs -- -- -- -- 3.39     Home Exercise Plan   Plans to continue exercise at -- Home (comment)  walking Home (comment)  walking Home (comment)  walking Home (comment)  walking   Frequency -- Add 2 additional days to program exercise sessions. Add 2 additional days to program exercise sessions. Add 2 additional days to program exercise sessions. Add 2 additional days to program exercise sessions.   Initial Home Exercises Provided -- 08/16/21 08/16/21 08/16/21 08/16/21     Oxygen   Maintain Oxygen Saturation -- -- 88% or higher 88% or higher 88% or higher    Row Name 10/10/21 0900             Response to Exercise   Blood Pressure (Admit) 98/70       Blood Pressure (Exit) 102/64       Heart Rate (Admit) 64 bpm       Heart Rate (Exercise) 109 bpm       Heart Rate (Exit) 89 bpm       Rating of Perceived Exertion (Exercise) 13       Symptoms none       Duration  Continue with 30 min of aerobic exercise without signs/symptoms of physical distress.  Intensity THRR unchanged         Progression   Progression Continue to progress workloads to maintain intensity without signs/symptoms of physical distress.       Average METs 4.04         Resistance Training   Training Prescription Yes       Weight 6 lb       Reps 10-15         Interval Training   Interval Training No         Treadmill   MPH 3       Grade 4       Minutes 15       METs 4.95         Recumbant Elliptical   Level 3.5       Minutes 15       METs 2.9         REL-XR   Level 3       Minutes 15       METs 3.7         T5 Nustep   Level 1       Minutes 15       METs 2.6         Track   Laps 50       Minutes 15       METs 3.72         Home Exercise Plan   Plans to continue exercise at Home (comment)  walking       Frequency Add 2 additional days to program exercise sessions.       Initial Home Exercises Provided 08/16/21         Oxygen   Maintain Oxygen Saturation 88% or higher                Exercise Comments:   Exercise Comments     Row Name 08/08/21 1337 10/10/21 1350         Exercise Comments First full day of exercise!  Patient was oriented to gym and equipment including functions, settings, policies, and procedures.  Patient's individual exercise prescription and treatment plan were reviewed.  All starting workloads were established based on the results of the 6 minute walk test done at initial orientation visit.  The plan for exercise progression was also introduced and progression will be customized based on patient's performance and goals. Stephone graduated today from  rehab with 36 sessions completed.  Details of the patient's exercise prescription and what He needs to do in order to continue the prescription and progress were discussed with patient.  Patient was given a copy of prescription and goals.  Patient verbalized understanding.  Adam Hunter plans to  continue to exercise by walking at home.               Exercise Goals and Review:   Exercise Goals     Row Name 08/06/21 1444             Exercise Goals   Increase Physical Activity Yes       Intervention Provide advice, education, support and counseling about physical activity/exercise needs.;Develop an individualized exercise prescription for aerobic and resistive training based on initial evaluation findings, risk stratification, comorbidities and participant's personal goals.       Expected Outcomes Short Term: Attend rehab on a regular basis to increase amount of physical activity.;Long Term: Add in home exercise to make exercise part of routine and to increase amount  of physical activity.;Long Term: Exercising regularly at least 3-5 days a week.       Increase Strength and Stamina Yes       Intervention Provide advice, education, support and counseling about physical activity/exercise needs.;Develop an individualized exercise prescription for aerobic and resistive training based on initial evaluation findings, risk stratification, comorbidities and participant's personal goals.       Expected Outcomes Short Term: Increase workloads from initial exercise prescription for resistance, speed, and METs.;Short Term: Perform resistance training exercises routinely during rehab and add in resistance training at home;Long Term: Improve cardiorespiratory fitness, muscular endurance and strength as measured by increased METs and functional capacity (6MWT)       Able to understand and use rate of perceived exertion (RPE) scale Yes       Intervention Provide education and explanation on how to use RPE scale       Expected Outcomes Short Term: Able to use RPE daily in rehab to express subjective intensity level;Long Term:  Able to use RPE to guide intensity level when exercising independently       Able to understand and use Dyspnea scale Yes       Intervention Provide education and explanation  on how to use Dyspnea scale       Expected Outcomes Short Term: Able to use Dyspnea scale daily in rehab to express subjective sense of shortness of breath during exertion;Long Term: Able to use Dyspnea scale to guide intensity level when exercising independently       Knowledge and understanding of Target Heart Rate Range (THRR) Yes       Intervention Provide education and explanation of THRR including how the numbers were predicted and where they are located for reference       Expected Outcomes Short Term: Able to state/look up THRR;Long Term: Able to use THRR to govern intensity when exercising independently;Short Term: Able to use daily as guideline for intensity in rehab       Able to check pulse independently Yes       Intervention Provide education and demonstration on how to check pulse in carotid and radial arteries.;Review the importance of being able to check your own pulse for safety during independent exercise       Expected Outcomes Short Term: Able to explain why pulse checking is important during independent exercise;Long Term: Able to check pulse independently and accurately       Understanding of Exercise Prescription Yes       Intervention Provide education, explanation, and written materials on patient's individual exercise prescription       Expected Outcomes Short Term: Able to explain program exercise prescription;Long Term: Able to explain home exercise prescription to exercise independently                Exercise Goals Re-Evaluation :  Exercise Goals Re-Evaluation     Row Name 08/08/21 1338 08/16/21 1346 08/29/21 1623 09/26/21 1211 10/03/21 1347     Exercise Goal Re-Evaluation   Exercise Goals Review Increase Physical Activity;Able to understand and use rate of perceived exertion (RPE) scale;Knowledge and understanding of Target Heart Rate Range (THRR);Understanding of Exercise Prescription;Increase Strength and Stamina;Able to understand and use Dyspnea scale;Able  to check pulse independently Increase Physical Activity;Increase Strength and Stamina;Knowledge and understanding of Target Heart Rate Range (THRR);Able to understand and use rate of perceived exertion (RPE) scale;Able to understand and use Dyspnea scale;Able to check pulse independently;Understanding of Exercise Prescription Increase Physical Activity;Increase Strength and Stamina;Understanding  of Exercise Prescription Increase Physical Activity;Increase Strength and Stamina;Understanding of Exercise Prescription Increase Physical Activity;Increase Strength and Stamina;Understanding of Exercise Prescription   Comments Reviewed RPE and dyspnea scales, THR and program prescription with pt today.  Pt voiced understanding and was given a copy of goals to take home. Reviewed home exercise with pt today.  Pt plans to walk and use elliptical at home for exercise.  We talked about using our staff videos as well.  Reviewed THR, pulse, RPE, sign and symptoms, pulse oximetery and when to call 911 or MD.  Also discussed weather considerations and indoor options.  Pt voiced understanding. Adam Hunter is doing well for the first couple of weeks that he has been at rehab. He has already increased his workload on the treadmill to a 4% incline. He also increased to level 4 on the XR and is now using 5 lbs for handweights. He is hitting his THR during exercise. We will continue to monitor. Adam Hunter continues to do well in rehab. He stays compliant with the program and is already due for his post 6MWT in the next week, which he hope to see significant improvement. He tried out the track for the first time and was able to complete 44 laps. He increased his level on the T4 Nustep back to 4. His TM workloads vary a little bit and would like to see that a little more consistent. Will continue to monitor. Adam Hunter has improved his walk test by 9%. He walks at least 2 miles a day and also does the resistance exercises from rehab at home. He is thinking  about joing a gym once he graduates rehab.   Expected Outcomes Short: Use RPE daily to regulate intensity. Long: Follow program prescription in THR. Short: Start to add in exercise at home Long: Continue to improve stamina. Short: Continue to increase workload on treadmill Long: Continue to increase overall MET level Short: Improve on post 6MWT Long: Continue to build up overall strength and stamina ST:/LT: continue to exercise regularly once graduated from rehab.    [place] Name 10/10/21 0948             Exercise Goal Re-Evaluation   Exercise Goals Review Increase Physical Activity;Increase Strength and Stamina;Understanding of Exercise Prescription       Comments Adam Hunter continues to do well in rehab. He improved on his post 6MWT by 130 feet! He is also up to 50 laps on the track. He improved to 3 mph and a 4% incline on the treadmill as well. We will continue to monitor Adam Hunter's progress until he graduates from the program.       Expected Outcomes Short: Graduate Long: Continue to exercise independently.                Discharge Exercise Prescription (Final Exercise Prescription Changes):  Exercise Prescription Changes - 10/10/21 0900       Response to Exercise   Blood Pressure (Admit) 98/70    Blood Pressure (Exit) 102/64    Heart Rate (Admit) 64 bpm    Heart Rate (Exercise) 109 bpm    Heart Rate (Exit) 89 bpm    Rating of Perceived Exertion (Exercise) 13    Symptoms none    Duration Continue with 30 min of aerobic exercise without signs/symptoms of physical distress.    Intensity THRR unchanged      Progression   Progression Continue to progress workloads to maintain intensity without signs/symptoms of physical distress.    Average METs 4.04  Resistance Training   Training Prescription Yes    Weight 6 lb    Reps 10-15      Interval Training   Interval Training No      Treadmill   MPH 3    Grade 4    Minutes 15    METs 4.95      Recumbant Elliptical   Level 3.5     Minutes 15    METs 2.9      REL-XR   Level 3    Minutes 15    METs 3.7      T5 Nustep   Level 1    Minutes 15    METs 2.6      Track   Laps 50    Minutes 15    METs 3.72      Home Exercise Plan   Plans to continue exercise at Home (comment)   walking   Frequency Add 2 additional days to program exercise sessions.    Initial Home Exercises Provided 08/16/21      Oxygen   Maintain Oxygen Saturation 88% or higher             Nutrition:  Target Goals: Understanding of nutrition guidelines, daily intake of sodium '1500mg'$ , cholesterol '200mg'$ , calories 30% from fat and 7% or less from saturated fats, daily to have 5 or more servings of fruits and vegetables.  Education: All About Nutrition: -Group instruction provided by verbal, written material, interactive activities, discussions, models, and posters to present general guidelines for heart healthy nutrition including fat, fiber, MyPlate, the role of sodium in heart healthy nutrition, utilization of the nutrition label, and utilization of this knowledge for meal planning. Follow up email sent as well. Written material given at graduation. Flowsheet Row Cardiac Rehab from 10/03/2021 in [facility] Cardiac and Pulmonary Rehab  Education need identified 08/06/21  Date 09/05/21  Educator [place]  Instruction Review Code 1- Verbalizes Understanding       Biometrics:  Pre Biometrics - 08/06/21 1445       Pre Biometrics   Height $Remov'5\' 11"'RMYxWR$  (1.803 m)    Weight 247 lb 4.8 oz (112.2 kg)    BMI (Calculated) 34.51    Single Leg Stand 30 seconds             Post Biometrics - 10/01/21 1528        Post  Biometrics   Height $Remov'5\' 11"'gwWwzL$  (1.803 m)    Weight 248 lb 4.8 oz (112.6 kg)    BMI (Calculated) 34.65    Single Leg Stand 12.16 seconds             Nutrition Therapy Plan and Nutrition Goals:  Nutrition Therapy & Goals - 09/04/21 1401       Nutrition Therapy   Diet Heart healthy, T2DM MNT    Drug/Food Interactions  Statins/Certain Fruits    Protein (specify units) 90g    Fiber 30 grams    Whole Grain Foods 3 servings    Saturated Fats 16 max. grams    Fruits and Vegetables 8 servings/day    Sodium 2 grams      Personal Nutrition Goals   Nutrition Goal ST: include additional variety in foods LT: continue with current changes    Comments 61 y.o. M admitted to rehab status post coronary artery stent placement. PMHx includes CAD, HLD, STEMI (2011), emphysema, HTN, T2DM. Relevant medications includes lipitor, vit D, farxiga, hydrochlorothiazide, metformin, protonix, vit C, zinc. PYP Score: 69. Vegetables &  Fruits 7/12. Breads, Grains & Cereals 6/12. Red & Processed Meat 10/12. Poultry 2/2. Fish & Shellfish 2/4. Beans, Nuts & Seeds 1/4. Milk & Dairy Foods 5/6. Toppings, Oils, Seasonings & Salt 16/20. Sweets, Snacks & Restaurant Food 11/14. Beverages 9/10. Last A1C ~6.4/6.5. Drinks: water, sometimes sweet tea, sometimes body armor - sugar-free, . He loves roasting food especially vegetables like brussels sprouts. He grows herbs in the summer time. B: keto sausage balls once in a while or egg cups with vegetables or a bagel thins (whole grain)  L: roasted chicken, butternut squash, onions, beets, baby carrots. S: blackberries, strawberries, blueberries and raspberries with some monk fruit D: salmon, chicken, sweet potatoes, broccoli, salads, beans. Usually uses unsalted butter or olive oil, He will use a pinch of salt to cooking when he does, but usually uses pepper. Reviewed heart healthy and T2DM friendly eating - Adam Hunter is coming to nutrition education tomorrow.      Intervention Plan   Intervention Prescribe, educate and counsel regarding individualized specific dietary modifications aiming towards targeted core components such as weight, hypertension, lipid management, diabetes, heart failure and other comorbidities.    Expected Outcomes Short Term Goal: Understand basic principles of dietary content, such as  calories, fat, sodium, cholesterol and nutrients.;Short Term Goal: A plan has been developed with personal nutrition goals set during dietitian appointment.;Long Term Goal: Adherence to prescribed nutrition plan.             Nutrition Assessments:  MEDIFICTS Score Key: ?70 Need to make dietary changes  40-70 Heart Healthy Diet ? 40 Therapeutic Level Cholesterol Diet  Flowsheet Row Cardiac Rehab from 10/03/2021 in [facility] Cardiac and Pulmonary Rehab  Picture Your Plate Total Score on Admission 69  Picture Your Plate Total Score on Discharge 85      Picture Your Plate Scores: <72 Unhealthy dietary pattern with much room for improvement. 41-50 Dietary pattern unlikely to meet recommendations for good health and room for improvement. 51-60 More healthful dietary pattern, with some room for improvement.  >60 Healthy dietary pattern, although there may be some specific behaviors that could be improved.    Nutrition Goals Re-Evaluation:  Nutrition Goals Re-Evaluation     [place] Name 10/03/21 1341             Goals   Nutrition Goal ST: LT: continue with heart healthy eating after graduating rehab.       Comment He is doing well with his diet - he is eating carbohydrates packaged with fiber, he is limiting bread products, eating lean sources of protein, and lots of vegetables. He has a garden and loves to eat bruscetta in the summer. He has no questions at this time and feels he has no barriers to continuing eating well.       Expected Outcome ST: LT: continue with heart healthy eating after graduating rehab.                Nutrition Goals Discharge (Final Nutrition Goals Re-Evaluation):  Nutrition Goals Re-Evaluation - 10/03/21 1341       Goals   Nutrition Goal ST: LT: continue with heart healthy eating after graduating rehab.    Comment He is doing well with his diet - he is eating carbohydrates packaged with fiber, he is limiting bread products, eating lean sources of  protein, and lots of vegetables. He has a garden and loves to eat bruscetta in the summer. He has no questions at this time and feels he has no barriers to  continuing eating well.    Expected Outcome ST: LT: continue with heart healthy eating after graduating rehab.             Psychosocial: Target Goals: Acknowledge presence or absence of significant depression and/or stress, maximize coping skills, provide positive support system. Participant is able to verbalize types and ability to use techniques and skills needed for reducing stress and depression.   Education: Stress, Anxiety, and Depression - Group verbal and visual presentation to define topics covered.  Reviews how body is impacted by stress, anxiety, and depression.  Also discusses healthy ways to reduce stress and to treat/manage anxiety and depression.  Written material given at graduation. Flowsheet Row Cardiac Rehab from 10/03/2021 in [facility] Cardiac and Pulmonary Rehab  Date 10/03/21  Educator [facility]  Instruction Review Code 1- [employer] Understanding       Education: Sleep Hygiene -Provides group verbal and written instruction about how sleep can affect your health.  Define sleep hygiene, discuss sleep cycles and impact of sleep habits. Review good sleep hygiene tips.    Initial Review & Psychosocial Screening:  Initial Psych Review & Screening - 07/27/21 1012       Initial Review   Current issues with None Identified      Family Dynamics   Good Support System? Yes   Wife and children.     Barriers   Psychosocial barriers to participate in program There are no identifiable barriers or psychosocial needs.;The patient should benefit from training in stress management and relaxation.      Screening Interventions   Interventions Encouraged to exercise;To provide support and resources with identified psychosocial needs;Provide feedback about the scores to participant    Expected Outcomes Short Term goal: Utilizing  psychosocial counselor, staff and physician to assist with identification of specific Stressors or current issues interfering with healing process. Setting desired goal for each stressor or current issue identified.;Long Term Goal: Stressors or current issues are controlled or eliminated.;Short Term goal: Identification and review with participant of any Quality of Life or Depression concerns found by scoring the questionnaire.;Long Term goal: The participant improves quality of Life and PHQ9 Scores as seen by post scores and/or verbalization of changes             Quality of Life Scores:   Quality of Life - 10/03/21 1325       Quality of Life Scores   Health/Function Pre 26.7 %    Health/Function Post 28.4 %    Health/Function % Change 6.37 %    Socioeconomic Pre 30 %    Socioeconomic Post 30 %    Socioeconomic % Change  0 %    Psych/Spiritual Pre 28.07 %    Psych/Spiritual Post 28.93 %    Psych/Spiritual % Change 3.06 %    Family Pre 28.8 %    Family Post 30 %    Family % Change 4.17 %    GLOBAL Pre 28.03 %    GLOBAL Post 29.1 %    GLOBAL % Change 3.82 %            Scores of 19 and below usually indicate a poorer quality of life in these areas.  A difference of  2-3 points is a clinically meaningful difference.  A difference of 2-3 points in the total score of the Quality of Life Index has been associated with significant improvement in overall quality of life, self-image, physical symptoms, and general health in studies assessing change in quality of life.  PHQ-9: Review Flowsheet       10/03/2021 08/06/2021  Depression screen PHQ 2/9  Decreased Interest 0 0  Down, Depressed, Hopeless 0 0  PHQ - 2 Score 0 0  Altered sleeping 0 0  Tired, decreased energy 0 0  Change in appetite 0 0  Feeling bad or failure about yourself  0 0  Trouble concentrating 0 0  Moving slowly or fidgety/restless 0 0  Suicidal thoughts 0 0  PHQ-9 Score 0 0  Difficult doing work/chores Not  difficult at all Not difficult at all   Interpretation of Total Score  Total Score Depression Severity:  1-4 = Minimal depression, 5-9 = Mild depression, 10-14 = Moderate depression, 15-19 = Moderately severe depression, 20-27 = Severe depression   Psychosocial Evaluation and Intervention:  Psychosocial Evaluation - 07/27/21 1027       Psychosocial Evaluation & Interventions   Interventions Encouraged to exercise with the program and follow exercise prescription    Comments Adam Hunter has no barriers to attending the program. He lives with his wiife and their 2 dogs. His children live within 10 miles and arepart ofhis support along with his wife.  He has had a heart attack in the past. He is ready to start the program and get back to his daily routines and back to his fishing trips. He should do well.    Expected Outcomes STG Adam Hunter attends all scheduled sessions. He progresses to be able to return to his hobbies and daily life. LTG Adam Hunter will continue to progress his exercise and ability to continue his ADL and hobbies    Continue Psychosocial Services  Follow up required by staff             Psychosocial Re-Evaluation:  Psychosocial Re-Evaluation     Row Name 09/06/21 1342 10/03/21 1349           Psychosocial Re-Evaluation   Current issues with None Identified None Identified      Comments Patient reports no issues with their current mental states, sleep, stress, depression or anxiety. Will follow up with patient in a few weeks for any changes. Patient reports no issues with their current mental states, sleep, stress, depression or anxiety. Patient came to today's education about stress and mental.      Expected Outcomes Short: Continue to exercise regularly to support mental health and notify staff of any changes. Long: maintain mental health and well being through teaching of rehab or prescribed medications independently. ST: Continue to exercise regularly to support mental health and  maintain positive attitude after graduation. LT: maintain mental health and well being through teaching of rehab or prescribed medications independently.      Interventions Encouraged to attend Cardiac Rehabilitation for the exercise Encouraged to attend Cardiac Rehabilitation for the exercise      Continue Psychosocial Services  Follow up required by staff Follow up required by staff               Psychosocial Discharge (Final Psychosocial Re-Evaluation):  Psychosocial Re-Evaluation - 10/03/21 1349       Psychosocial Re-Evaluation   Current issues with None Identified    Comments Patient reports no issues with their current mental states, sleep, stress, depression or anxiety. Patient came to today's education about stress and mental.    Expected Outcomes ST: Continue to exercise regularly to support mental health and maintain positive attitude after graduation. LT: maintain mental health and well being through teaching of rehab or prescribed medications independently.  Interventions Encouraged to attend Cardiac Rehabilitation for the exercise    Continue Psychosocial Services  Follow up required by staff             Vocational Rehabilitation: Provide vocational rehab assistance to qualifying candidates.   Vocational Rehab Evaluation & Intervention:   Education: Education Goals: Education classes will be provided on a variety of topics geared toward better understanding of heart health and risk factor modification. Participant will state understanding/return demonstration of topics presented as noted by education test scores.  Learning Barriers/Preferences:  Learning Barriers/Preferences - 07/27/21 1014       Learning Barriers/Preferences   Learning Barriers None    Learning Preferences None             General Cardiac Education Topics:  AED/CPR: - Group verbal and written instruction with the use of models to demonstrate the basic use of the AED with the basic  ABC's of resuscitation.   Anatomy and Cardiac Procedures: - Group verbal and visual presentation and models provide information about basic cardiac anatomy and function. Reviews the testing methods done to diagnose heart disease and the outcomes of the test results. Describes the treatment choices: Medical Management, Angioplasty, or Coronary Bypass Surgery for treating various heart conditions including Myocardial Infarction, Angina, Valve Disease, and Cardiac Arrhythmias.  Written material given at graduation. Flowsheet Row Cardiac Rehab from 10/03/2021 in [facility] Cardiac and Pulmonary Rehab  Date 08/22/21  Educator SB  Instruction Review Code 1- Verbalizes Understanding       Medication Safety: - Group verbal and visual instruction to review commonly prescribed medications for heart and lung disease. Reviews the medication, class of the drug, and side effects. Includes the steps to properly store meds and maintain the prescription regimen.  Written material given at graduation. Flowsheet Row Cardiac Rehab from 10/03/2021 in [facility] Cardiac and Pulmonary Rehab  Date 09/12/21  Educator KB  Instruction Review Code 1- Verbalizes Understanding       Intimacy: - Group verbal instruction through game format to discuss how heart and lung disease can affect sexual intimacy. Written material given at graduation.. Flowsheet Row Cardiac Rehab from 10/03/2021 in [facility] Cardiac and Pulmonary Rehab  Date 08/15/21  Educator [place]  Instruction Review Code 1- Verbalizes Understanding       Know Your Numbers and Heart Failure: - Group verbal and visual instruction to discuss disease risk factors for cardiac and pulmonary disease and treatment options.  Reviews associated critical values for Overweight/Obesity, Hypertension, Cholesterol, and Diabetes.  Discusses basics of heart failure: signs/symptoms and treatments.  Introduces Heart Failure Zone chart for action plan for heart failure.  Written material given  at graduation. Flowsheet Row Cardiac Rehab from 10/03/2021 in [facility] Cardiac and Pulmonary Rehab  Date 09/19/21  Educator SB  Instruction Review Code 1- Verbalizes Understanding       Infection Prevention: - Provides verbal and written material to individual with discussion of infection control including proper hand washing and proper equipment cleaning during exercise session. Flowsheet Row Cardiac Rehab from 10/03/2021 in [facility] Cardiac and Pulmonary Rehab  Date 08/06/21  Educator [facility]  Instruction Review Code 1- Verbalizes Understanding       Falls Prevention: - Provides verbal and written material to individual with discussion of falls prevention and safety. Flowsheet Row Cardiac Rehab from 10/03/2021 in [facility] Cardiac and Pulmonary Rehab  Date 08/06/21  Educator [facility]  Instruction Review Code 1- Verbalizes Understanding       Other: -Provides group and verbal instruction on  various topics (see comments)   Knowledge Questionnaire Score:  Knowledge Questionnaire Score - 10/03/21 1325       Knowledge Questionnaire Score   Pre Score 25/26    Post Score 26/26             Core Components/Risk Factors/Patient Goals at Admission:  Personal Goals and Risk Factors at Admission - 08/06/21 1447       Core Components/Risk Factors/Patient Goals on Admission    Weight Management Yes    Intervention Weight Management: Develop a combined nutrition and exercise program designed to reach desired caloric intake, while maintaining appropriate intake of nutrient and fiber, sodium and fats, and appropriate energy expenditure required for the weight goal.;Weight Management: Provide education and appropriate resources to help participant work on and attain dietary goals.;Weight Management/Obesity: Establish reasonable short term and long term weight goals.;Obesity: Provide education and appropriate resources to help participant work on and attain dietary goals.    Admit Weight 247 lb 4.8 oz  (112.2 kg)    Goal Weight: Short Term 240 lb (108.9 kg)    Goal Weight: Long Term 210 lb (95.3 kg)    Expected Outcomes Short Term: Continue to assess and modify interventions until short term weight is achieved;Long Term: Adherence to nutrition and physical activity/exercise program aimed toward attainment of established weight goal;Weight Loss: Understanding of general recommendations for a balanced deficit meal plan, which promotes 1-2 lb weight loss per week and includes a negative energy balance of [phone] kcal/d;Understanding of distribution of calorie intake throughout the day with the consumption of 4-5 meals/snacks;Understanding recommendations for meals to include 15-35% energy as protein, 25-35% energy from fat, 35-60% energy from carbohydrates, less than $RemoveB'200mg'xEkSRRRF$  of dietary cholesterol, 20-35 gm of total fiber daily    Diabetes Yes    Intervention Provide education about signs/symptoms and action to take for hypo/hyperglycemia.;Provide education about proper nutrition, including hydration, and aerobic/resistive exercise prescription along with prescribed medications to achieve blood glucose in normal ranges: Fasting glucose 65-99 mg/dL    Expected Outcomes Short Term: Participant verbalizes understanding of the signs/symptoms and immediate care of hyper/hypoglycemia, proper foot care and importance of medication, aerobic/resistive exercise and nutrition plan for blood glucose control.;Long Term: Attainment of HbA1C < 7%.    Heart Failure Yes    Intervention Provide a combined exercise and nutrition program that is supplemented with education, support and counseling about heart failure. Directed toward relieving symptoms such as shortness of breath, decreased exercise tolerance, and extremity edema.    Expected Outcomes Improve functional capacity of life;Short term: Attendance in program 2-3 days a week with increased exercise capacity. Reported lower sodium intake. Reported increased fruit and  vegetable intake. Reports medication compliance.;Short term: Daily weights obtained and reported for increase. Utilizing diuretic protocols set by physician.;Long term: Adoption of self-care skills and reduction of barriers for early signs and symptoms recognition and intervention leading to self-care maintenance.    Hypertension Yes    Intervention Provide education on lifestyle modifcations including regular physical activity/exercise, weight management, moderate sodium restriction and increased consumption of fresh fruit, vegetables, and low fat dairy, alcohol moderation, and smoking cessation.;Monitor prescription use compliance.    Expected Outcomes Short Term: Continued assessment and intervention until BP is < 140/89mm HG in hypertensive participants. < 130/37mm HG in hypertensive participants with diabetes, heart failure or chronic kidney disease.;Long Term: Maintenance of blood pressure at goal levels.    Lipids Yes    Intervention Provide education and support for participant on nutrition &  aerobic/resistive exercise along with prescribed medications to achieve LDL '70mg'$ , HDL >$Remo'40mg'eVhRc$ .    Expected Outcomes Short Term: Participant states understanding of desired cholesterol values and is compliant with medications prescribed. Participant is following exercise prescription and nutrition guidelines.;Long Term: Cholesterol controlled with medications as prescribed, with individualized exercise RX and with personalized nutrition plan. Value goals: LDL < $Rem'70mg'gQsB$ , HDL > 40 mg.             Education:Diabetes - Individual verbal and written instruction to review signs/symptoms of diabetes, desired ranges of glucose level fasting, after meals and with exercise. Acknowledge that pre and post exercise glucose checks will be done for 3 sessions at entry of program.   Core Components/Risk Factors/Patient Goals Review:   Goals and Risk Factor Review     Row Name 09/06/21 1337 10/03/21 1338            Core Components/Risk Factors/Patient Goals Review   Personal Goals Review Weight Management/Obesity Weight Management/Obesity;Hypertension;Diabetes      Review Adam Hunter states that he does not eat to much but  has not lost weight. He knows he had a bit to eat last weekend. Sometimes he may not eat much, but maybe another time he might have a bigger meal. Adam Hunter blood pressure has been doing well and sometimes low. His Olmesartin medication has been cut back from his doctor but is doing well. He states he is not getting dizzy when he is up and moving around. He is checking his blood pressure at home. Adam Hunter reports his BP has been doing well: 96-102/68. He checks his BP at home everyday. He checks his BG at least 3x/week - in the morning it has been running 145-155 in the am, but 2 hours after breakfast it is around 110. He has been trying to eat small snack near bedtime to avoid low BG overnight. He is mindful of what he is eating and exercising outside of rehab. His weight has been stable and continues to weight daily.      Expected Outcomes Short: lose 5 pounds in the next couple weeks. Long: reach his weight goal of 210lbs. ST:/LT: continue to monitor risk factors after graduating rehab.               Core Components/Risk Factors/Patient Goals at Discharge (Final Review):   Goals and Risk Factor Review - 10/03/21 1338       Core Components/Risk Factors/Patient Goals Review   Personal Goals Review Weight Management/Obesity;Hypertension;Diabetes    Review Adam Hunter reports his BP has been doing well: 96-102/68. He checks his BP at home everyday. He checks his BG at least 3x/week - in the morning it has been running 145-155 in the am, but 2 hours after breakfast it is around 110. He has been trying to eat small snack near bedtime to avoid low BG overnight. He is mindful of what he is eating and exercising outside of rehab. His weight has been stable and continues to weight daily.    Expected Outcomes ST:/LT:  continue to monitor risk factors after graduating rehab.             ITP Comments:  ITP Comments     Row Name 07/27/21 1022 08/06/21 1438 08/08/21 1337 08/29/21 0912 09/04/21 1443   ITP Comments Virtual orientation call completed today. he has an appointment on Date: 08/06/2021  for EP eval and gym Orientation.  Documentation of diagnosis can be found in [facility] Date: 07/19/2021 . Completed 6MWT and gym orientation. Initial ITP  created and sent for review to Dr. Emily Filbert, Medical Director. First full day of exercise!  Patient was oriented to gym and equipment including functions, settings, policies, and procedures.  Patient's individual exercise prescription and treatment plan were reviewed.  All starting workloads were established based on the results of the 6 minute walk test done at initial orientation visit.  The plan for exercise progression was also introduced and progression will be customized based on patient's performance and goals. 30 Day review completed. Medical Director ITP review done, changes made as directed, and signed approval by Medical Director. Completed initial RD consultation    [place] Name 09/26/21 1344 10/10/21 1350         ITP Comments 30 Day review completed. Medical Director ITP review done, changes made as directed, and signed approval by Medical Director. Adam Hunter graduated today from  rehab with 36 sessions completed.  Details of the patient's exercise prescription and what He needs to do in order to continue the prescription and progress were discussed with patient.  Patient was given a copy of prescription and goals.  Patient verbalized understanding.  Adam Hunter plans to continue to exercise by walking at home.               Comments: discharge ITP

## 2021-10-10 NOTE — Patient Instructions (Signed)
Discharge Patient Instructions  Patient Details  Name: Adam Hunter MRN: [id number] Date of Birth: [birth year] Referring Provider:  Leonard Hunter, *   Number of Visits: 36  Reason for Discharge:  Patient reached a stable level of exercise. Patient independent in their exercise. Patient has met program and personal goals.  Smoking History:  Social History   Tobacco Use  Smoking Status Former   Packs/day: 1.50   Years: 30.00   Total pack years: 45.00   Types: Cigarettes   Quit date: 01/22/2010   Years since quitting: 11.7  Smokeless Tobacco Former    Diagnosis:  Status post coronary artery stent placement  Initial Exercise Prescription:  Initial Exercise Prescription - 08/06/21 1400       Date of Initial Exercise RX and Referring Provider   Date 08/06/21    Referring Provider Adam Hunter      Oxygen   Maintain Oxygen Saturation 88% or higher      Treadmill   MPH 3    Grade 1    Minutes 15    METs 3.71      Recumbant Bike   Level 3    RPM 60    Minutes 15    METs 3.46      NuStep   Level 4    SPM 80    Minutes 15    METs 3.46      Elliptical   Level 1    Speed 3    Minutes 15    METs 3.46      REL-XR   Level 3    Speed 50    Minutes 15    METs 3.46      Biostep-RELP   Level 3    SPM 50    Minutes 15    METs 3.46      Prescription Details   Frequency (times per week) 3    Duration Progress to 30 minutes of continuous aerobic without signs/symptoms of physical distress      Intensity   THRR 40-80% of Max Heartrate 115-145    Ratings of Perceived Exertion 11-13    Perceived Dyspnea 0-4      Progression   Progression Continue to progress workloads to maintain intensity without signs/symptoms of physical distress.      Resistance Training   Training Prescription Yes    Weight 4    Reps 10-15             Discharge Exercise Prescription (Final Exercise Prescription Changes):  Exercise Prescription Changes - 10/10/21  0900       Response to Exercise   Blood Pressure (Admit) 98/70    Blood Pressure (Exit) 102/64    Heart Rate (Admit) 64 bpm    Heart Rate (Exercise) 109 bpm    Heart Rate (Exit) 89 bpm    Rating of Perceived Exertion (Exercise) 13    Symptoms none    Duration Continue with 30 min of aerobic exercise without signs/symptoms of physical distress.    Intensity THRR unchanged      Progression   Progression Continue to progress workloads to maintain intensity without signs/symptoms of physical distress.    Average METs 4.04      Resistance Training   Training Prescription Yes    Weight 6 lb    Reps 10-15      Interval Training   Interval Training No      Treadmill   MPH 3    Grade 4  Minutes 15    METs 4.95      Recumbant Elliptical   Level 3.5    Minutes 15    METs 2.9      REL-XR   Level 3    Minutes 15    METs 3.7      T5 Nustep   Level 1    Minutes 15    METs 2.6      Track   Laps 50    Minutes 15    METs 3.72      Home Exercise Plan   Plans to continue exercise at Home (comment)   walking   Frequency Add 2 additional days to program exercise sessions.    Initial Home Exercises Provided 08/16/21      Oxygen   Maintain Oxygen Saturation 88% or higher             Functional Capacity:  6 Minute Walk     Row Name 08/06/21 1438 10/01/21 1527       6 Minute Walk   Phase Initial Discharge    Distance 1435 feet 1565 feet    Distance % Change -- 9 %    Distance Feet Change -- 130 ft    Walk Time 6 minutes 6 minutes    # of Rest Breaks 0 0    MPH 2.72 2.96    METS 3.46 3.44    RPE 10 10    Perceived Dyspnea  0 0    VO2 Peak 12.12 12.04    Symptoms No No    Resting HR 85 bpm 64 bpm    Resting BP 122/82 98/60    Resting Oxygen Saturation  96 % 95 %    Exercise Oxygen Saturation  during 6 min walk 96 % 92 %    Max Ex. HR 109 bpm 102 bpm    Max Ex. BP 142/80 120/62    2 Minute Post BP 114/72 --            Nutrition & Weight -  Outcomes:  Pre Biometrics - 08/06/21 1445       Pre Biometrics   Height $Remov'5\' 11"'PWpZRE$  (1.803 m)    Weight 247 lb 4.8 oz (112.2 kg)    BMI (Calculated) 34.51    Single Leg Stand 30 seconds             Post Biometrics - 10/01/21 1528        Post  Biometrics   Height $Remov'5\' 11"'QZqHEE$  (1.803 m)    Weight 248 lb 4.8 oz (112.6 kg)    BMI (Calculated) 34.65    Single Leg Stand 12.16 seconds             Nutrition:  Nutrition Therapy & Goals - 09/04/21 1401       Nutrition Therapy   Diet Heart healthy, T2DM MNT    Drug/Food Interactions Statins/Certain Fruits    Protein (specify units) 90g    Fiber 30 grams    Whole Grain Foods 3 servings    Saturated Fats 16 max. grams    Fruits and Vegetables 8 servings/day    Sodium 2 grams      Personal Nutrition Goals   Nutrition Goal ST: include additional variety in foods LT: continue with current changes    Comments 61 y.o. M admitted to rehab status post coronary artery stent placement. PMHx includes CAD, HLD, STEMI (2011), emphysema, HTN, T2DM. Relevant medications includes lipitor, vit D, farxiga, hydrochlorothiazide, metformin, protonix,  vit C, zinc. PYP Score: 69. Vegetables & Fruits 7/12. Breads, Grains & Cereals 6/12. Red & Processed Meat 10/12. Poultry 2/2. Fish & Shellfish 2/4. Beans, Nuts & Seeds 1/4. Milk & Dairy Foods 5/6. Toppings, Oils, Seasonings & Salt 16/20. Sweets, Snacks & Restaurant Food 11/14. Beverages 9/10. Last A1C ~6.4/6.5. Drinks: water, sometimes sweet tea, sometimes body armor - sugar-free, . He loves roasting food especially vegetables like brussels sprouts. He grows herbs in the summer time. B: keto sausage balls once in a while or egg cups with vegetables or a bagel thins (whole grain)  L: roasted chicken, butternut squash, onions, beets, baby carrots. S: blackberries, strawberries, blueberries and raspberries with some monk fruit D: salmon, chicken, sweet potatoes, broccoli, salads, beans. Usually uses unsalted butter or  olive oil, He will use a pinch of salt to cooking when he does, but usually uses pepper. Reviewed heart healthy and T2DM friendly eating - Adam Hunter is coming to nutrition education tomorrow.      Intervention Plan   Intervention Prescribe, educate and counsel regarding individualized specific dietary modifications aiming towards targeted core components such as weight, hypertension, lipid management, diabetes, heart failure and other comorbidities.    Expected Outcomes Short Term Goal: Understand basic principles of dietary content, such as calories, fat, sodium, cholesterol and nutrients.;Short Term Goal: A plan has been developed with personal nutrition goals set during dietitian appointment.;Long Term Goal: Adherence to prescribed nutrition plan.            Goals reviewed with patient; copy given to patient.

## 2021-10-24 ENCOUNTER — Ambulatory Visit (INDEPENDENT_AMBULATORY_CARE_PROVIDER_SITE_OTHER): Payer: BC Managed Care – PPO | Admitting: Cardiovascular Disease

## 2021-10-24 ENCOUNTER — Telehealth: Payer: Self-pay | Admitting: Cardiovascular Disease

## 2021-10-24 ENCOUNTER — Encounter: Payer: Self-pay | Admitting: Cardiovascular Disease

## 2021-10-24 VITALS — BP 100/74 | HR 60 | Ht 72.0 in | Wt 250.0 lb

## 2021-10-24 DIAGNOSIS — I2510 Atherosclerotic heart disease of native coronary artery without angina pectoris: Secondary | ICD-10-CM

## 2021-10-24 DIAGNOSIS — I10 Essential (primary) hypertension: Secondary | ICD-10-CM

## 2021-10-24 DIAGNOSIS — E785 Hyperlipidemia, unspecified: Secondary | ICD-10-CM | POA: Diagnosis not present

## 2021-10-24 DIAGNOSIS — G4733 Obstructive sleep apnea (adult) (pediatric): Secondary | ICD-10-CM

## 2021-10-24 DIAGNOSIS — I5042 Chronic combined systolic (congestive) and diastolic (congestive) heart failure: Secondary | ICD-10-CM

## 2021-10-24 DIAGNOSIS — E119 Type 2 diabetes mellitus without complications: Secondary | ICD-10-CM

## 2021-10-24 DIAGNOSIS — N522 Drug-induced erectile dysfunction: Secondary | ICD-10-CM

## 2021-10-24 LAB — LIPID PANEL
Chol/HDL Ratio: 4.6 ratio (ref 0.0–5.0)
Cholesterol, Total: 110 mg/dL (ref 100–199)
HDL: 24 mg/dL — ABNORMAL LOW (ref >39)
LDL Chol Calc (NIH): 45 mg/dL (ref 0–99)
Triglycerides: 261 mg/dL — ABNORMAL HIGH (ref 0–149)
VLDL Cholesterol Cal: 41 mg/dL — ABNORMAL HIGH (ref 5–40)

## 2021-10-24 LAB — BASIC METABOLIC PANEL
BUN/Creatinine Ratio: 17 (ref 10–24)
BUN: 21 mg/dL (ref 8–27)
CO2: 26 mmol/L (ref 20–29)
Calcium: 10.1 mg/dL (ref 8.6–10.2)
Chloride: 99 mmol/L (ref 96–106)
Creatinine, Ser: 1.23 mg/dL (ref 0.76–1.27)
Glucose: 140 mg/dL — ABNORMAL HIGH (ref 70–99)
Potassium: 4.4 mmol/L (ref 3.5–5.2)
Sodium: 140 mmol/L (ref 134–144)
eGFR: 67 mL/min/{1.73_m2} (ref >59)

## 2021-10-24 LAB — HEMOGLOBIN A1C
Est. average glucose Bld gHb Est-mCnc: 148 mg/dL
Hgb A1c MFr Bld: 6.8 % — ABNORMAL HIGH (ref 4.8–5.6)

## 2021-10-24 MED ORDER — HYDROCHLOROTHIAZIDE 25 MG PO TABS: 12.5000 mg | ORAL_TABLET | Freq: Every day | ORAL | 3 refills | Status: DC

## 2021-10-24 NOTE — Telephone Encounter (Signed)
Called pt. After speaking with Dr. Salena Saner Hydrochlorothiazide reduced to 12.5 mg daily. New prescription sent in by his nurse. No further questions at this time.

## 2021-10-24 NOTE — Telephone Encounter (Signed)
Patient is calling to make sure that he is getting the correct dosage of medication hydrochlorothiazide (HYDRODIURIL) 25 MG tablet

## 2021-10-24 NOTE — Patient Instructions (Addendum)
Medication Instructions:  DECREASE the Hydrochlorothiazide to 12.5 mg (half of the 25 mg tablet) once daily  *If you need a refill on your cardiac medications before your next appointment, please call your pharmacy*   Lab Work: Your provider would like for you to have the following labs today: Lipid, A1C, and BMET  If you have labs (blood work) drawn today and your tests are completely normal, you will receive your results only by: MyChart Message (if you have MyChart) OR A paper copy in the mail If you have any lab test that is abnormal or we need to change your treatment, we will call you to review the results.   Testing/Procedures: None ordered   Follow-Up: At [facility], you and your health needs are our priority.  As part of our continuing mission to provide you with exceptional heart care, we have created designated Provider Care Teams.  These Care Teams include your primary Cardiologist (physician) and Advanced Practice Providers (APPs -  Physician Assistants and Nurse Practitioners) who all work together to provide you with the care you need, when you need it.  We recommend signing up for the patient portal called "MyChart".  Sign up information is provided on this After Visit Summary.  MyChart is used to connect with patients for Virtual Visits (Telemedicine).  Patients are able to view lab/test results, encounter notes, upcoming appointments, etc.  Non-urgent messages can be sent to your provider as well.   To learn more about what you can do with MyChart, go to [email or web address].    Your next appointment:   9 month(s)  The format for your next appointment:   In Person  Provider:   Thurmon Fair, MD {   Important Information About Sugar

## 2021-10-24 NOTE — Progress Notes (Signed)
Cardiology office note   Evaluation Performed:  Follow-up visit  Date:  10/24/2021   ID:  Adam Hunter, DOB [birth year], MRN [id number]  PCP:  Kaleen Mask, MD  Cardiologist:  Jonika Critz Electrophysiologist:  None   Chief Complaint:  CAD f/u  History of Present Illness:    Adam Hunter has a history of acute ST segment elevation myocardial infarction October 2011 due to occlusion of a large septal branch of the LAD artery, treated medically, returning with progressive angina in March 2023 and found to have a 99% proximal LAD stenosis with TIMI II flow, treated with placement of a drug-eluting stent (Synergy 3.0 x 48 mm); the first diagonal had a 50% stenosis, whilst his other coronaries appeared ectatic but did not have any other meaningful stenoses.  Note was made of mild-moderate anterior wall hypokinesis with EF 50% but LVEDP was normal at 10 mmHg.  Additional comorbidities include hypertension, dyslipidemia with severely decreased HDL, obstructive sleep apnea on CPAP and morbid obesity (he gained a lot of weight after stopping smoking).  He feels well.  He really enjoyed cardiac rehab.  He has continued exercising 7 days a week, by walking 2.5 miles a day (he splits this up into 3x20-minute stretches) and lifting some weights.  He feels much better after the stent procedure.  He no longer has chest pressure (this was described as retrosternal and radiating through to his back between his shoulder blades, different from his original presentation in 2011 when he had left shoulder pain).  The patient specifically denies any chest pain at rest exertion, dyspnea at rest or with exertion, orthopnea, paroxysmal nocturnal dyspnea, syncope, palpitations, focal neurological deficits, intermittent claudication, lower extremity edema, unexplained weight gain, cough, hemoptysis or wheezing.  He has not had any falls or bleeding problems.  He is compliant with CPAP and denies daytime  hypersomnolence.  Since increasing activity level he has had occasional episodes of lower blood pressure (reported a systolic blood pressure less than 100 only on 1 occasion) and some orthostatic hypotension.  After his stent we increase his atorvastatin to 80 mg daily, due to disease progression even though his LDL cholesterol was only 60.  He has fair glucose control with a hemoglobin A1c of 6.8%.  He has chronically low HDL, most recently measured at 26.  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  Past Medical History:  Diagnosis Date   CAD (coronary artery disease) 11/25/2012   STEMI October 2011 secondary to occlusion of a large septal branch of the LAD artery treated medically   Emphysema    Heart attack (HCC) 01/2010   non-STEMI   Hyperlipidemia    Hyperlipidemia 11/25/2012   Hypertension    OSA on CPAP    Past Surgical History:  Procedure Laterality Date   CARDIAC CATHETERIZATION  01/20/2010   Diffuse coronary atherosclerosis w/coronary ectasia,totalled first septal perforator. non-obstructive LAD/diagonal,left CX and RCA stenosis   CORONARY ATHERECTOMY N/A 07/19/2021   Procedure: CORONARY ATHERECTOMY;  Surgeon: Lyn Records, MD;  Location[facility] INVASIVE CV LAB;  Service: Cardiovascular;  Laterality: N/A;   CORONARY STENT INTERVENTION N/A 07/19/2021   Procedure: CORONARY STENT INTERVENTION;  Surgeon: Lyn Records, MD;  Location: [facility] INVASIVE CV LAB;  Service: Cardiovascular;  Laterality: N/A;   FINGER SURGERY  1974   INTRAVASCULAR ULTRASOUND/IVUS N/A 07/19/2021   Procedure: Intravascular Ultrasound/IVUS;  Surgeon: Verdis Prime  W, MD;  Location: [place] CV LAB;  Service: Cardiovascular;  Laterality: N/A;   LEFT HEART CATH AND CORONARY ANGIOGRAPHY N/A 07/19/2021   Procedure: LEFT HEART CATH AND CORONARY ANGIOGRAPHY;  Surgeon: Belva Crome, MD;  Location: [place] CV LAB;   Service: Cardiovascular;  Laterality: N/A;   NM MYOCAR PERF WALL MOTION  05/22/2010   normal,EF 53%.   US ECHOCARDIOGRAPHY  01/20/2010   mod. LVH,grade I diastolic dysfunction,mild AI,MR,MV annular ca+     Current Meds  Medication Sig   Ascorbic Acid (VITAMIN C) 1000 MG tablet Take 1,000 mg by mouth daily.   aspirin EC 81 MG tablet Take 1 tablet (81 mg total) by mouth daily.   atorvastatin (LIPITOR) 80 MG tablet Take 1 tablet (80 mg total) by mouth daily.   Cholecalciferol (VITAMIN D) 50 MCG (2000 UT) tablet Take 2,000 Units by mouth daily.   clopidogrel (PLAVIX) 75 MG tablet TAKE 1 TABLET BY MOUTH DAILY   dapagliflozin propanediol (FARXIGA) 10 MG TABS tablet Take 1 tablet (10 mg total) by mouth daily before breakfast.   metFORMIN (GLUCOPHAGE) 500 MG tablet Take 1 tablet (500 mg total) by mouth 2 (two) times daily.   metoprolol succinate (TOPROL-XL) 100 MG 24 hr tablet TAKE 1 TABLET BY MOUTH DAILY WITH OR IMMEDIATELY FOLLOWING A MEAL   olmesartan (BENICAR) 20 MG tablet Take 1 tablet (20 mg total) by mouth daily.   pantoprazole (PROTONIX) 40 MG tablet Take 1 tablet (40 mg total) by mouth daily. Further refills by PCP.   sildenafil (VIAGRA) 50 MG tablet Take 1 tablet (50 mg total) by mouth daily as needed for erectile dysfunction.   Zinc 50 MG TABS Take 50 mg by mouth 2 (two) times a week. Wednesdays and Sundays   [DISCONTINUED] hydrochlorothiazide (HYDRODIURIL) 25 MG tablet TAKE 1 TABLET BY MOUTH DAILY   Current Facility-Administered Medications for the 10/24/21 encounter (Office Visit) with Jermayne Sweeney, Dani Gobble, MD  Medication   sodium chloride flush (NS) 0.9 % injection 3 mL     Allergies:   Patient has no known allergies.   Social History   Tobacco Use   Smoking status: Former    Packs/day: 1.50    Years: 30.00    Total pack years: 45.00    Types: Cigarettes    Quit date: 01/22/2010    Years since quitting: 11.7   Smokeless tobacco: Former  Scientific laboratory technician Use: Never used   Substance Use Topics   Alcohol use: Yes    Comment: social   Drug use: No     Family Hx: The patient's family history includes Cancer in his maternal grandfather; Heart attack in his father; Heart disease in his father; Hypertension in his father and mother.  ROS:   Please see the history of present illness.    All other systems are reviewed and are negative.  Prior CV studies:   The following studies were reviewed today:  Echocardiogram 07/04/2021  1. Left ventricular ejection fraction, by estimation, is 40 to 45%. Left ventricular ejection fraction by 2D MOD biplane is 42.8 %. The left ventricle has mildly decreased function. The left ventricle demonstrates global hypokinesis. There is mild left ventricular hypertrophy. Left ventricular diastolic parameters are consistent with Grade I diastolic dysfunction (impaired relaxation).   2. Right ventricular systolic function is normal. The right ventricular size is mildly enlarged.   3. Right atrial size was mildly dilated.   4. The mitral valve is grossly normal. Trivial mitral valve  regurgitation.   5. The aortic valve is tricuspid. Aortic valve regurgitation is not visualized.   6. Aortic dilatation noted. There is mild dilatation of the aortic root, measuring 44 mm. There is borderline dilatation of the ascending aorta, measuring 38 mm.   Comparison(s): Prior images unable to be directly viewed, comparison made by report only. Changes from prior study are noted. 01/20/2010: LVEF 50-55%, mild MR.  Labs/Other Tests and Data Reviewed:    Cardiac catheterization 07/19/2021   Diagnostic Dominance: Right Intervention  Implants     Permanent Stent  Synergy Xd 3.0x48     Proximal and mid 99% LAD stenoses within a calcified segment and resultant TIMI grade II flow.  Following orbital atherectomy of both lesions, followed by balloon expansion using a Score flex scoring balloon, a Synergy 3.0 x 48 mm drug-eluting stent was deployed at  14 atm.  The proximal margin of the stent was postdilated at high pressure with a 3.5 mm balloon.  0% stenosis and TIMI grade III flow was noted. Coronaries otherwise ectatic but without obstructive disease.  The large first diagonal contains 50% obstruction. Mild to moderate hypokinesis is noted in the mid anterior wall.  EF 50% LVEDP 10 mmHg   RECOMMENDATIONS:   Aggressive risk factor modification.  High intensity statin therapy. Because of the duration of the procedure and contrast used, patient will spend the night and be discharged in a.m.  EKG: ECG was not ordered today.  ECG from 07/23/2021 shows sinus rhythm and old right bundle branch block, QTc 455 ms  Recent Labs: 07/20/2021: BUN 16; Creatinine, Ser 1.12; Hemoglobin 13.0; Platelets 209; Potassium 3.2; Sodium 138   Recent Lipid Panel Lab Results  Component Value Date/Time   CHOL 117 07/05/2021 11:27 AM   TRIG 186 (H) 07/05/2021 11:27 AM   HDL 26 (L) 07/05/2021 11:27 AM   CHOLHDL 4.5 07/05/2021 11:27 AM   CHOLHDL 5.4 (H) 05/16/2016 10:22 AM   LDLCALC 60 07/05/2021 11:27 AM    Wt Readings from Last 3 Encounters:  10/24/21 250 lb (113.4 kg)  10/01/21 248 lb 4.8 oz (112.6 kg)  08/21/21 249 lb 8 oz (113.2 kg)     Objective:    Vital Signs:  BP 100/74 (BP Location: Left Arm, Patient Position: Sitting, Cuff Size: Large)   Pulse 60   Ht 6' (1.829 m)   Wt 250 lb (113.4 kg)   SpO2 96%   BMI 33.91 kg/m     General: Alert, oriented x3, no distress, moderately obese Head: no evidence of trauma, PERRL, EOMI, no exophtalmos or lid lag, no myxedema, no xanthelasma; normal ears, nose and oropharynx Neck: normal jugular venous pulsations and no hepatojugular reflux; brisk carotid pulses without delay and no carotid bruits Chest: clear to auscultation, no signs of consolidation by percussion or palpation, normal fremitus, symmetrical and full respiratory excursions Cardiovascular: normal position and quality of the apical impulse,  regular rhythm, normal first and second heart sounds, no murmurs, rubs or gallops Abdomen: no tenderness or distention, no masses by palpation, no abnormal pulsatility or arterial bruits, normal bowel sounds, no hepatosplenomegaly Extremities: no clubbing, cyanosis or edema; 2+ radial, ulnar and brachial pulses bilaterally; 2+ right femoral, posterior tibial and dorsalis pedis pulses; 2+ left femoral, posterior tibial and dorsalis pedis pulses; no subclavian or femoral bruits Neurological: grossly nonfocal Psych: Normal mood and affect    ASSESSMENT & PLAN:    1. Coronary artery disease involving native coronary artery of native heart without angina pectoris  2. Chronic combined systolic and diastolic heart failure ([place])   3. Essential hypertension   4. Dyslipidemia (high LDL; low HDL)   5. Type 2 diabetes mellitus without complication, without long-term current use of insulin ([place])   6. OSA (obstructive sleep apnea)   7. Drug-induced erectile dysfunction        CAD: Currently asymptomatic after placement of the stent in the LAD artery, with plans to continue dual antiplatelet therapy at least through April 2024.  Continue the beta-blocker and high-dose atorvastatin.  We again discussed the possibility of developing in-stent restenosis, increased compared to the average patient due to the very long stent (48 mm) and the presence of diabetes mellitus. Left ventricular systolic and diastolic dysfunction: Suspect these were transient abnormalities and if we were to look again would see that his EF is now normal.  Developed symptoms of heart failure due to proximal LAD artery ischemia.  On Farxiga 10 mg daily.  LVEDP was normal at cath.  He does not require loop diuretics. HTN: Currently well controlled, possibly even excessively so since he has occasional orthostatic dizziness.  Better blood pressure control may be related to more attention to sodium restriction and daily activity, even though  he has not lost any weight.  We will decrease the hydrochlorothiazide to just 12.5 mg daily.  Losartan did not provide 24-hour control. HLP: We will recheck his lipid profile today.  Chronically low HDL cholesterol, needs to try to lose weight.  Even before increasing the dose of atorvastatin his LDL was good at 60.  Had very mild hypertriglyceridemia.  On maximum dose atorvastatin.  Since he has evidence of disease progression with such a good LDL, would target LDL less than 55.  DM: Good control.  Prefer SGLT2 inhibitors and if necessary GLP-1 agonist for control, for improved cardiovascular outcomes. OSA: good compliance with CPAP, denies daytime hypersomnolence. ED: Satisfactory resolution of this complaint with sildenafil.  Reminded him PDE 5 inhibitors cannot be combined with nitrates.   Patient Instructions  Medication Instructions:  DECREASE the Hydrochlorothiazide to 12.5 mg (half of the 25 mg tablet) once daily  *If you need a refill on your cardiac medications before your next appointment, please call your pharmacy*   Lab Work: Your provider would like for you to have the following labs today: Lipid, A1C, and BMET  If you have labs (blood work) drawn today and your tests are completely normal, you will receive your results only by: [place] (if you have MyChart) OR A paper copy in the mail If you have any lab test that is abnormal or we need to change your treatment, we will call you to review the results.   Testing/Procedures: None ordered   Follow-Up: At [facility], you and your health needs are our priority.  As part of our continuing mission to provide you with exceptional heart care, we have created designated Provider Care Teams.  These Care Teams include your primary Cardiologist (physician) and Advanced Practice Providers (APPs -  Physician Assistants and Nurse Practitioners) who all work together to provide you with the care you need, when you need it.  We  recommend signing up for the patient portal called "MyChart".  Sign up information is provided on this After Visit Summary.  MyChart is used to connect with patients for Virtual Visits (Telemedicine).  Patients are able to view lab/test results, encounter notes, upcoming appointments, etc.  Non-urgent messages can be sent to your provider as well.   To  learn more about what you can do with MyChart, go to [email or web address].    Your next appointment:   9 month(s)  The format for your next appointment:   In Person  Provider:   Sanda Klein, MD {   Important Information About Sugar         Signed, Sanda Klein, MD  10/24/2021 2:28 PM    [place]

## 2021-11-09 DIAGNOSIS — E119 Type 2 diabetes mellitus without complications: Secondary | ICD-10-CM | POA: Diagnosis not present

## 2021-11-09 DIAGNOSIS — H5213 Myopia, bilateral: Secondary | ICD-10-CM | POA: Diagnosis not present

## 2021-11-12 ENCOUNTER — Other Ambulatory Visit: Payer: Self-pay | Admitting: Physician Assistant

## 2021-11-15 ENCOUNTER — Other Ambulatory Visit: Payer: Self-pay | Admitting: Cardiovascular Disease

## 2021-11-15 DIAGNOSIS — G4733 Obstructive sleep apnea (adult) (pediatric): Secondary | ICD-10-CM | POA: Diagnosis not present

## 2021-11-16 ENCOUNTER — Other Ambulatory Visit: Payer: Self-pay | Admitting: Cardiovascular Disease

## 2021-12-13 DIAGNOSIS — I10 Essential (primary) hypertension: Secondary | ICD-10-CM | POA: Diagnosis not present

## 2021-12-13 DIAGNOSIS — Z0000 Encounter for general adult medical examination without abnormal findings: Secondary | ICD-10-CM | POA: Diagnosis not present

## 2021-12-13 DIAGNOSIS — Z1211 Encounter for screening for malignant neoplasm of colon: Secondary | ICD-10-CM | POA: Diagnosis not present

## 2021-12-16 DIAGNOSIS — G4733 Obstructive sleep apnea (adult) (pediatric): Secondary | ICD-10-CM | POA: Diagnosis not present

## 2021-12-31 ENCOUNTER — Other Ambulatory Visit: Payer: Self-pay | Admitting: Cardiovascular Disease

## 2022-01-15 DIAGNOSIS — G4733 Obstructive sleep apnea (adult) (pediatric): Secondary | ICD-10-CM | POA: Diagnosis not present

## 2022-02-12 ENCOUNTER — Other Ambulatory Visit: Payer: Self-pay | Admitting: Cardiovascular Disease

## 2022-04-26 ENCOUNTER — Emergency Department (HOSPITAL_COMMUNITY): Payer: BC Managed Care – PPO

## 2022-04-26 ENCOUNTER — Encounter (HOSPITAL_COMMUNITY): Payer: Self-pay

## 2022-04-26 ENCOUNTER — Other Ambulatory Visit: Payer: Self-pay

## 2022-04-26 ENCOUNTER — Other Ambulatory Visit: Payer: Self-pay | Admitting: Cardiology

## 2022-04-26 ENCOUNTER — Emergency Department (HOSPITAL_COMMUNITY)
Admission: EM | Admit: 2022-04-26 | Discharge: 2022-04-26 | Disposition: A | Discharge status: Home / Self Care | Payer: BC Managed Care – PPO | Attending: Emergency Medicine | Admitting: Emergency Medicine

## 2022-04-26 ENCOUNTER — Telehealth: Payer: Self-pay | Admitting: Cardiovascular Disease

## 2022-04-26 DIAGNOSIS — I2510 Atherosclerotic heart disease of native coronary artery without angina pectoris: Secondary | ICD-10-CM

## 2022-04-26 DIAGNOSIS — R52 Pain, unspecified: Secondary | ICD-10-CM | POA: Diagnosis not present

## 2022-04-26 DIAGNOSIS — Z79899 Other long term (current) drug therapy: Secondary | ICD-10-CM | POA: Insufficient documentation

## 2022-04-26 DIAGNOSIS — Z7984 Long term (current) use of oral hypoglycemic drugs: Secondary | ICD-10-CM | POA: Diagnosis not present

## 2022-04-26 DIAGNOSIS — R079 Chest pain, unspecified: Secondary | ICD-10-CM | POA: Diagnosis not present

## 2022-04-26 DIAGNOSIS — I10 Essential (primary) hypertension: Secondary | ICD-10-CM | POA: Insufficient documentation

## 2022-04-26 DIAGNOSIS — Z7982 Long term (current) use of aspirin: Secondary | ICD-10-CM | POA: Diagnosis not present

## 2022-04-26 DIAGNOSIS — R0789 Other chest pain: Secondary | ICD-10-CM | POA: Diagnosis not present

## 2022-04-26 DIAGNOSIS — Z7902 Long term (current) use of antithrombotics/antiplatelets: Secondary | ICD-10-CM | POA: Insufficient documentation

## 2022-04-26 DIAGNOSIS — I491 Atrial premature depolarization: Secondary | ICD-10-CM | POA: Diagnosis not present

## 2022-04-26 DIAGNOSIS — E119 Type 2 diabetes mellitus without complications: Secondary | ICD-10-CM | POA: Insufficient documentation

## 2022-04-26 LAB — COMPREHENSIVE METABOLIC PANEL
ALT: 51 U/L — ABNORMAL HIGH (ref 0–44)
AST: 50 U/L — ABNORMAL HIGH (ref 15–41)
Albumin: 4.1 g/dL (ref 3.5–5.0)
Alkaline Phosphatase: 66 U/L (ref 38–126)
Anion gap: 11 (ref 5–15)
BUN: 15 mg/dL (ref 8–23)
CO2: 24 mmol/L (ref 22–32)
Calcium: 9.4 mg/dL (ref 8.9–10.3)
Chloride: 101 mmol/L (ref 98–111)
Creatinine, Ser: 1.3 mg/dL — ABNORMAL HIGH (ref 0.61–1.24)
GFR, Estimated: >60 mL/min (ref >60)
Glucose, Bld: 166 mg/dL — ABNORMAL HIGH (ref 70–99)
Potassium: 4.3 mmol/L (ref 3.5–5.1)
Sodium: 136 mmol/L (ref 135–145)
Total Bilirubin: 1.6 mg/dL — ABNORMAL HIGH (ref 0.3–1.2)
Total Protein: 7.3 g/dL (ref 6.5–8.1)

## 2022-04-26 LAB — CBC WITH DIFFERENTIAL/PLATELET
Abs Immature Granulocytes: 0.06 10*3/uL (ref 0.00–0.07)
Basophils Absolute: 0 10*3/uL (ref 0.0–0.1)
Basophils Relative: 0 %
Eosinophils Absolute: 0.3 10*3/uL (ref 0.0–0.5)
Eosinophils Relative: 3 %
HCT: 48.7 % (ref 39.0–52.0)
Hemoglobin: 16.5 g/dL (ref 13.0–17.0)
Immature Granulocytes: 1 %
Lymphocytes Relative: 17 %
Lymphs Abs: 1.4 10*3/uL (ref 0.7–4.0)
MCH: 30.1 pg (ref 26.0–34.0)
MCHC: 33.9 g/dL (ref 30.0–36.0)
MCV: 88.9 fL (ref 80.0–100.0)
Monocytes Absolute: 0.4 10*3/uL (ref 0.1–1.0)
Monocytes Relative: 5 %
Neutro Abs: 6.2 10*3/uL (ref 1.7–7.7)
Neutrophils Relative %: 74 %
Platelets: 223 10*3/uL (ref 150–400)
RBC: 5.48 MIL/uL (ref 4.22–5.81)
RDW: 14.3 % (ref 11.5–15.5)
WBC: 8.4 10*3/uL (ref 4.0–10.5)
nRBC: 0 % (ref 0.0–0.2)

## 2022-04-26 LAB — TROPONIN I (HIGH SENSITIVITY)
Troponin I (High Sensitivity): 27 ng/L — ABNORMAL HIGH (ref <18)
Troponin I (High Sensitivity): 27 ng/L — ABNORMAL HIGH (ref <18)

## 2022-04-26 NOTE — Telephone Encounter (Signed)
Pt c/o of Chest Pain: STAT if CP now or developed within 24 hours  1. Are you having CP right now? Not having pain now.  The pain is more on the left side of his chest and his back, and under his shoulder blade.   2. Are you experiencing any other symptoms (ex. SOB, nausea, vomiting, sweating)? No other symptoms  3. How long have you been experiencing CP? Has been going on since the beginning of the week.   4. Is your CP continuous or coming and going? Comes and goes.   5. Have you taken Nitroglycerin? No  ?

## 2022-04-26 NOTE — ED Triage Notes (Signed)
Coming from home with CP started 1 week ago more in the back then chest called cardiologist today because it had not gone away they recommended to take nitro to come here. 324 ASA given EKG for ems showed r bndle branch block

## 2022-04-26 NOTE — Consult Note (Addendum)
Cardiology Consultation   Patient ID: Adam Hunter MRN: [id number]; DOB: 05/22/1960  Admit date: 04/26/2022 Date of Consult: 04/26/2022  PCP:  Kaleen MaskElkins, Wilson Oliver, MD   [facility] Providers Cardiologist:  Thurmon FairMihai Croitoru, MD      Patient Profile:   Adam Hunter is a 62 y.o. male with a hx of CAD, HTN type 2 DM, HLD, OSA on CPAP, morbid obesity who is being seen 04/26/2022 for the evaluation of chest pain at the request of Dr. Anitra LauthPlunkett.   History of Present Illness:   Adam Hunter is a 62 year old male with above medial history who is followed by Dr. Royann Shiversroitoru. Per chart review, patient has a history of acute STEMI in 01/2010 due to occlusion of a large septal branch of the LAD artery. Patient was managed medically. He did not have any further coronary events or cardiac complaints until 06/2021 when he complained of chest pressure, decreased exercise tolerance. Echocardiogram on 07/06/21 showed EF 40-45%, mild LVH, grade I diastolic dysfunction, normal RV systolic function, mild dilation of the aortic root measuring 44 mm and borderline dilation of the ascending aorta measuring 38 mm. Patient underwent coronary CT on 07/18/2021 that showed severe CAD with subtotal occlusion of the proximal LAD, coronary calcium score of 1915 (99th percentile). Given these findings, patient was set up for a cardiac catheterization. He underwent left heart catheterization with Dr. Katrinka BlazingSmith on 07/19/2021 which revealed proximal and mid 99% LAD stenoses. Underwent ortbital atherectomy of both lesions followed by balloon expansion. Had a 48mm DES placed to the prox-mid LAD. Was discharged on ASA, clopidogrel.   Patient was last seen by cardiology on 10/24/21, at which time he was doing well. He had been participating in cardiac rehab, and was walking 2.5 miles daily. No longer had chest pressure. Noted to have occasional episodes of lower BP and some orthostatic hypotension.  Patient presented to the ED on 1/12  complaining of pain in his left chest and between his shoulder blades that had been present for the past week. Initial EKG showed sinus rhythm with PVC present, RBBB. Note RBBB has been present on EKG since 2021. Labs in the ED showed Na 136, K 4.3, creatinine 1.30, WBC 8.4, hemoglobin 16.5, platelets 223. hsTn 27, repeat pending.   On interview, patient reports that he came in for evaluation of pain located between his shoulder blade on his left side/armpit. Pain has been present for one week. Feels like a dull ache, at worst about a 3/10. Pain occasionally radiates to his left shoulder. 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 However, pain has not yet resolved. Has been intermittent for the past week, and when he has an episode of pain it typically lasts for several hours. Pain occurs randomly and is not associated with exertion. He walks 2.5 miles daily, and does not have back/chest/shoulder pain while walking. He has felt somewhat sob while walking for the past few weeks. Denies persistent cough, nasal congestion, nausea, vomiting, syncope, near syncope, dizziness. Denies fever, chills, body aches, recent illness. Denies ankle edema, weight gain   Before he had his stent placed in 07/2021, patient complained of pain in his chest that radiate to his back. This pain is somewhat similar to symptoms prior to stent, but is not the same.   Past Medical History:  Diagnosis Date   CAD (coronary artery disease) 11/25/2012   STEMI October 2011  secondary to occlusion of a large septal branch of the LAD artery treated medically   Emphysema    Heart attack ([place]) 01/2010   non-STEMI   Hyperlipidemia    Hyperlipidemia 11/25/2012   Hypertension    OSA on CPAP     Past Surgical History:  Procedure Laterality Date   CARDIAC CATHETERIZATION  01/20/2010   Diffuse coronary atherosclerosis w/coronary ectasia,totalled first septal perforator.  non-obstructive LAD/diagonal,left CX and RCA stenosis   CORONARY ATHERECTOMY N/A 07/19/2021   Procedure: CORONARY ATHERECTOMY;  Surgeon: Belva Crome, MD;  Location: [facility];  Service: Cardiovascular;  Laterality: N/A;   CORONARY STENT INTERVENTION N/A 07/19/2021   Procedure: CORONARY STENT INTERVENTION;  Surgeon: Belva Crome, MD;  Location: [facility];  Service: Cardiovascular;  Laterality: N/A;   FINGER SURGERY  1974   INTRAVASCULAR ULTRASOUND/IVUS N/A 07/19/2021   Procedure: Intravascular Ultrasound/IVUS;  Surgeon: Belva Crome, MD;  Location: [facility];  Service: Cardiovascular;  Laterality: N/A;   LEFT HEART CATH AND CORONARY ANGIOGRAPHY N/A 07/19/2021   Procedure: LEFT HEART CATH AND CORONARY ANGIOGRAPHY;  Surgeon: Belva Crome, MD;  Location: [facility];  Service: Cardiovascular;  Laterality: N/A;   NM MYOCAR PERF WALL MOTION  05/22/2010   normal,EF 53%.   US ECHOCARDIOGRAPHY  01/20/2010   mod. LVH,grade I diastolic dysfunction,mild AI,MR,MV annular ca+     Home Medications:  Prior to Admission medications   Medication Sig Start Date End Date Taking? Authorizing Provider  Ascorbic Acid (VITAMIN C) 1000 MG tablet Take 1,000 mg by mouth daily.   Yes [provider]  aspirin EC 81 MG tablet Take 1 tablet (81 mg total) by mouth daily. 05/04/15  Yes Croitoru, Mihai, MD  atorvastatin (LIPITOR) 80 MG tablet Take 1 tablet (80 mg total) by mouth daily. 08/21/21  Yes Croitoru, Mihai, MD  Cholecalciferol (VITAMIN D) 50 MCG (2000 UT) tablet Take 2,000 Units by mouth daily.   Yes [provider]  clopidogrel (PLAVIX) 75 MG tablet Take 1 tablet (75 mg total) by mouth daily. Please contact the office to schedule appointment for additional refills. 02/13/22  Yes Croitoru, Mihai, MD  FARXIGA 10 MG TABS tablet TAKE 1 TABLET BY MOUTH DAILY BEFORE BREAKFAST 12/31/21  Yes Croitoru, Mihai, MD  hydrochlorothiazide (HYDRODIURIL) 25 MG tablet Take 0.5 tablets  (12.5 mg total) by mouth daily. 10/24/21  Yes Croitoru, Mihai, MD  metFORMIN (GLUCOPHAGE) 500 MG tablet Take 1 tablet (500 mg total) by mouth 2 (two) times daily. 07/05/21  Yes Croitoru, Mihai, MD  metoprolol succinate (TOPROL-XL) 100 MG 24 hr tablet TAKE 1 TABLET BY MOUTH DAILY WITH OR IMMEDIATELY FOLLOWING A MEAL Patient taking differently: Take 100 mg by mouth daily. WITH OR IMMEDIATELY FOLLOWING A MEAL 08/27/21  Yes Croitoru, Mihai, MD  nitroGLYCERIN (NITROSTAT) 0.4 MG SL tablet Place 1 tablet (0.4 mg total) under the tongue every 5 (five) minutes as needed. Patient taking differently: Place 0.4 mg under the tongue every 5 (five) minutes as needed for chest pain. 07/20/21  Yes Bhagat, Bhavinkumar, PA  olmesartan (BENICAR) 20 MG tablet Take 1 tablet (20 mg total) by mouth daily. 08/21/21  Yes Croitoru, Mihai, MD  pantoprazole (PROTONIX) 40 MG tablet Take 1 tablet (40 mg total) by mouth daily. Further refills by PCP. 09/12/21 09/12/22 Yes Bhagat, Bhavinkumar, PA  sildenafil (VIAGRA) 50 MG tablet Take 1 tablet (50 mg total) by mouth daily as needed for erectile dysfunction. 05/04/15  Yes Croitoru, Mihai,  MD  Zinc 50 MG TABS Take 50 mg by mouth 2 (two) times a week. Wednesdays and Sundays   Yes [provider]    Inpatient Medications: Scheduled Meds:  sodium chloride flush  3 mL Intravenous Q12H   Continuous Infusions:  PRN Meds:   Allergies:   No Known Allergies  Social History:   Social History   Socioeconomic History   Marital status: Married    Spouse name: Not on file   Number of children: Not on file   Years of education: Not on file   Highest education level: Not on file  Occupational History   Not on file  Tobacco Use   Smoking status: Former    Packs/day: 1.50    Years: 30.00    Total pack years: 45.00    Types: Cigarettes    Quit date: 01/22/2010    Years since quitting: 12.2   Smokeless tobacco: Former  Scientific laboratory technician Use: Never used  Substance and Sexual  Activity   Alcohol use: Yes    Comment: social   Drug use: No   Sexual activity: Not on file  Other Topics Concern   Not on file  Social History Narrative   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   All other ROS reviewed and negative.     Physical Exam/Data:   Vitals:   04/26/22 1030 04/26/22 1045 04/26/22 1100 04/26/22 1123  BP: 116/81 113/79 120/78 112/80  Pulse: 96 98 97 97  Resp: 20 (!) 24 16 15   SpO2: 93% 91% 93% 93%  Weight:      Height:       No intake or output data in the 24 hours ending 04/26/22 1229    04/26/2022   10:07 AM 10/24/2021    8:13 AM 10/01/2021    3:28 PM  Last 3 Weights  Weight (lbs) 250 lb 250 lb 248 lb 4.8 oz  Weight (kg) 113.4 kg 113.399 kg 112.628 kg     Body mass index is 33.91 kg/m.  General: Well nourished, well developed. Sitting upright in the bed in no acute distress.  HEENT: normal Neck: no JVD with head elevated  Vascular: Radial pulses 2+ bilaterally Cardiac:  normal S1, S2; RRR; no murmur. Distant heart sounds  Lungs:  clear to auscultation bilaterally, no wheezing, rhonchi or rales  Abd: soft, nontender, no hepatomegaly  Ext: no edema in BLE  Musculoskeletal:  No deformities, BUE and BLE strength normal and equal Skin: warm and dry  Neuro:  CNs 2-12 intact, no focal abnormalities noted Psych:  Normal affect   EKG:  The EKG was personally reviewed and demonstrates:   EKG showed sinus  rhythm with PVC present, RBBB. Note RBBB has been present on EKG since 2021 Telemetry:  Telemetry was personally reviewed and demonstrates:  NSR  Relevant CV Studies:   Laboratory Data:  High Sensitivity Troponin:   Recent Labs  Lab 04/26/22 1028  TROPONINIHS 27*     Chemistry Recent Labs  Lab 04/26/22 1028  NA 136  K 4.3  CL 101  CO2 24  GLUCOSE 166*  BUN 15  CREATININE 1.30*  CALCIUM 9.4  GFRNONAA >60  ANIONGAP 11    Recent Labs  Lab 04/26/22 1028  PROT 7.3  ALBUMIN 4.1  AST 50*  ALT 51*  ALKPHOS 66  BILITOT 1.6*   Lipids No results for input(s): "CHOL", "TRIG", "HDL", "LABVLDL", "LDLCALC", "CHOLHDL" in the last 168 hours.  Hematology Recent Labs  Lab 04/26/22 1028  WBC 8.4  RBC 5.48  HGB 16.5  HCT 48.7  MCV 88.9  MCH 30.1  MCHC 33.9  RDW 14.3  PLT 223   Thyroid No results for input(s): "TSH", "FREET4" in the last 168 hours.  BNPNo results for input(s): "BNP", "PROBNP" in the last 168 hours.  DDimer No results for input(s): "DDIMER" in the last 168 hours.   Radiology/Studies:  No results found.   Assessment and Plan:   Chest Pain  CAD  - Patient has a known history of CAD. Had a STEMI in 01/2010 due to occlusion of a large septal branch of the LAD that was managed medically. Later had DES to the LAD in 07/2021  - Now, patient presents complaining of pain between his shoulder blades, under his left arm. Occasionally radiates to his left shoulder. Not associated with exertion or relieved with rest. In fact, often occurs while he is at rest and lasts several hours at a time. Pain is achy, not relieved with nitro or tylenol (felt worse after taking nitro). Walks 2.5 miles daily and does not develop chest pain, but does feel like he is more short of breath than usual when walking. SOB does not limit his activities  - hsTn 27>27 - EKG with RBBB, normal sinus rhythm - CXR without active disease  - Pain is somewhat atypical as it is not associated  with exertion or relieved by rest. Not relieved by nitro. Trops flat - Plan nuclear stress test as an outpatient. MD to see  OSA on CPAP  - Patient reports excellent compliance with CPAP   Aortic Dilation  - Echocardiogram from 06/2021 showed mild dilation of the ascending aorta measuring 33 mm and borderline dilation of the ascending aorta measuring 38 mm  - Continue to follow as an outpatient -- needs a repeat echocardiogram in approx 3 months   HLD  - Continue lipitor 80 mg daily   Risk Assessment/Risk Scores:       For questions or updates, please contact [facility] Please consult [email or web address] for contact info under    Signed, Jonita Albee, PA-C  04/26/2022 12:29 PM  Patient seen, examined. Available data reviewed. Agree with findings, assessment, and plan as outlined by Robet Leu, PA-C.  The patient is independently interviewed and examined.  He is alert, oriented, in no distress.  HEENT is normal, JVP is normal, carotid upstrokes are normal without bruits, lungs are clear, heart is regular rate and rhythm no murmur gallop, abdomen is soft and nontender, extremities have no edema, skin is warm and dry with no rash.  EKG shows normal sinus rhythm with right bundle branch block, no ischemic ST segment changes are noted.  Troponins are low-level and flat at 27 and 27.  The patient's symptoms are atypical with back pain that he  thinks may be actually muscular in nature.  He has had some shortness of breath with activity.  Otherwise his objective findings are reassuring.  I do not think he requires hospital admission or inpatient workup.  With his history of long drug-eluting stent implantation in the LAD in April 2023, it seems reasonable to pursue outpatient ischemic testing.  Will arrange a Lexiscan Myoview stress test to be performed in the near future.  He otherwise will continue with his current medical therapy.  I reviewed ER precautions with him again.  He  understands that if he has accelerating symptoms, severe chest pain, or prolonged discomfort, he needs to seek immediate medical attention.  Sherren Mocha, M.D. 04/26/2022 4:00 PM

## 2022-04-26 NOTE — ED Provider Notes (Signed)
[facility] EMERGENCY DEPARTMENT Provider Note   CSN: [id number] Arrival date & time: 04/26/22  0957     History  Chief Complaint  Patient presents with   Chest Pain    Adam Hunter is a 62 y.o. male.  Patient is a 60 old male with a history of hypertension, diabetes, CAD status post stent in the LAD last year who has been doing very well after stent placement presenting today with complaint of pain in his left chest and between his shoulder blades for the last week after he was cleaning water out of his shed and mopping.  He initially thought it was just muscular pain but it has not gone away.  It does radiate to his shoulder and bicep.  He usually walks 2.5 miles daily and has noticed a little more effort to walk recently in the last week and has felt slightly winded.  No position seems to make the pain any better or worse.  He also feels like his legs are more fatigued but denies any unilateral symptoms.  He has not had cough, congestion, fever.  He has been compliant with his medications.  He has been taking half of his blood pressure pill and his blood pressure has been running approximately 841-660 systolic at home.  He called the cardiologist office today and they recommended he come here for further evaluation.  Currently he denies having significant pain.  It does not seem to be worse with exertion or eating.  The history is provided by the patient and medical records.  Chest Pain      Home Medications Prior to Admission medications   Medication Sig Start Date End Date Taking? Authorizing Provider  Ascorbic Acid (VITAMIN C) 1000 MG tablet Take 1,000 mg by mouth daily.   Yes [provider]  aspirin EC 81 MG tablet Take 1 tablet (81 mg total) by mouth daily. 05/04/15  Yes Croitoru, Mihai, MD  atorvastatin (LIPITOR) 80 MG tablet Take 1 tablet (80 mg total) by mouth daily. 08/21/21  Yes Croitoru, Mihai, MD  Cholecalciferol (VITAMIN D) 50 MCG (2000 UT)  tablet Take 2,000 Units by mouth daily.   Yes [provider]  clopidogrel (PLAVIX) 75 MG tablet Take 1 tablet (75 mg total) by mouth daily. Please contact the office to schedule appointment for additional refills. 02/13/22  Yes Croitoru, Mihai, MD  FARXIGA 10 MG TABS tablet TAKE 1 TABLET BY MOUTH DAILY BEFORE BREAKFAST 12/31/21  Yes Croitoru, Mihai, MD  hydrochlorothiazide (HYDRODIURIL) 25 MG tablet Take 0.5 tablets (12.5 mg total) by mouth daily. 10/24/21  Yes Croitoru, Mihai, MD  metFORMIN (GLUCOPHAGE) 500 MG tablet Take 1 tablet (500 mg total) by mouth 2 (two) times daily. 07/05/21  Yes Croitoru, Mihai, MD  metoprolol succinate (TOPROL-XL) 100 MG 24 hr tablet TAKE 1 TABLET BY MOUTH DAILY WITH OR IMMEDIATELY FOLLOWING A MEAL Patient taking differently: Take 100 mg by mouth daily. WITH OR IMMEDIATELY FOLLOWING A MEAL 08/27/21  Yes Croitoru, Mihai, MD  nitroGLYCERIN (NITROSTAT) 0.4 MG SL tablet Place 1 tablet (0.4 mg total) under the tongue every 5 (five) minutes as needed. Patient taking differently: Place 0.4 mg under the tongue every 5 (five) minutes as needed for chest pain. 07/20/21  Yes Bhagat, Bhavinkumar, PA  olmesartan (BENICAR) 20 MG tablet Take 1 tablet (20 mg total) by mouth daily. 08/21/21  Yes Croitoru, Mihai, MD  pantoprazole (PROTONIX) 40 MG tablet Take 1 tablet (40 mg total) by mouth daily. Further refills  by PCP. 09/12/21 09/12/22 Yes Bhagat, Bhavinkumar, PA  sildenafil (VIAGRA) 50 MG tablet Take 1 tablet (50 mg total) by mouth daily as needed for erectile dysfunction. 05/04/15  Yes Croitoru, Mihai, MD  Zinc 50 MG TABS Take 50 mg by mouth 2 (two) times a week. Wednesdays and Sundays   Yes [provider]      Allergies    Patient has no known allergies.    Review of Systems   Review of Systems  Cardiovascular:  Positive for chest pain.    Physical Exam Updated Vital Signs BP 106/68   Pulse 77   Resp 16   Ht 6' (1.829 m)   Wt 113.4 kg   SpO2 92%   BMI 33.91  kg/m  Physical Exam Vitals and nursing note reviewed.  Constitutional:      General: He is not in acute distress.    Appearance: He is well-developed.  HENT:     Head: Normocephalic and atraumatic.  Eyes:     Conjunctiva/sclera: Conjunctivae normal.     Pupils: Pupils are equal, round, and reactive to light.  Cardiovascular:     Rate and Rhythm: Normal rate and regular rhythm.     Pulses: Normal pulses.     Heart sounds: No murmur heard. Pulmonary:     Effort: Pulmonary effort is normal. No respiratory distress.     Breath sounds: Normal breath sounds. No wheezing or rales.     Comments: Pain is not reproduced with palpation Abdominal:     General: There is no distension.     Palpations: Abdomen is soft.     Tenderness: There is no abdominal tenderness. There is no guarding or rebound.  Musculoskeletal:        General: No tenderness. Normal range of motion.     Cervical back: Normal range of motion and neck supple.     Right lower leg: No edema.     Left lower leg: No edema.  Skin:    General: Skin is warm and dry.     Findings: No erythema or rash.     Comments: No rashes present consistent with zoster  Neurological:     Mental Status: He is alert and oriented to person, place, and time. Mental status is at baseline.  Psychiatric:        Behavior: Behavior normal.     ED Results / Procedures / Treatments   Labs (all labs ordered are listed, but only abnormal results are displayed) Labs Reviewed  COMPREHENSIVE METABOLIC PANEL - Abnormal; Notable for the following components:      Result Value   Glucose, Bld 166 (*)    Creatinine, Ser 1.30 (*)    AST 50 (*)    ALT 51 (*)    Total Bilirubin 1.6 (*)    All other components within normal limits  TROPONIN I (HIGH SENSITIVITY) - Abnormal; Notable for the following components:   Troponin I (High Sensitivity) 27 (*)    All other components within normal limits  TROPONIN I (HIGH SENSITIVITY) - Abnormal; Notable for the  following components:   Troponin I (High Sensitivity) 27 (*)    All other components within normal limits  CBC WITH DIFFERENTIAL/PLATELET    EKG EKG Interpretation  Date/Time:  Friday April 26 2022 10:05:45 EST Ventricular Rate:  93 PR Interval:    QRS Duration: 142 QT Interval:  398 QTC Calculation: 496 R Axis:   97 Text Interpretation: Sinus rhythm Ventricular premature complex Right bundle  branch block Anteroseptal infarct, age indeterminate Artifact No significant change since last tracing Confirmed by Blanchie Dessert [phone]) on 04/26/2022 10:17:29 AM  Radiology DG Chest Port 1 View  Result Date: 04/26/2022 CLINICAL DATA:  Chest pain. EXAM: PORTABLE CHEST 1 VIEW COMPARISON:  Chest x-ray 04/09/2012. FINDINGS: The heart size and mediastinal contours are within normal limits. Both lungs are clear. No visible pleural effusions or pneumothorax. No acute osseous abnormality. IMPRESSION: No active disease. Electronically Signed   By: Margaretha Sheffield M.D.   On: 04/26/2022 12:57    Procedures Procedures    Medications Ordered in ED Medications - No data to display  ED Course/ Medical Decision Making/ A&P                           Medical Decision Making Amount and/or Complexity of Data Reviewed External Data Reviewed: notes.    Details: cards Labs: ordered. Decision-making details documented in ED Course. Radiology: ordered and independent interpretation performed. Decision-making details documented in ED Course. ECG/medicine tests: ordered and independent interpretation performed. Decision-making details documented in ED Course.   Pt with multiple medical problems and comorbidities and presenting today with a complaint that caries a high risk for morbidity and mortality.  Here today with chest pain given patient's history and known long LAD stent that has an increased risk of restenosis concern for CAD versus musculoskeletal etiology.  Patient's symptoms are not classic for  acute lung pathology such as PE or pneumonia, low suspicion for anemia or electrolyte abnormalities, no evidence of zoster.  Patient has received 325 mg of aspirin prior to arrival and he has taken his Plavix today.  4:03 PM I independently interpreted patient's EKG and labs.  EKG with right bundle branch block that is unchanged from prior, CBC, CMP without acute changes, initial troponin of 27 without old to compare.  Consulted cardiology for further recommendations.  They will come and see the patient.  On repeat evaluation findings discussed with the patient and his wife.  He still is well-appearing and in no significant discomfort.  4:03 PM Repeat troponin is flat at 27.  Cardiology came and saw the patient.  They will plan on doing an outpatient Myoview.  At this time he is stable for discharge home.  Discussed with the patient and his wife and they are comfortable with this plan.  He was given return precautions.         Final Clinical Impression(s) / ED Diagnoses Final diagnoses:  Nonspecific chest pain    Rx / DC Orders ED Discharge Orders     None         Blanchie Dessert, MD 04/26/22 1603

## 2022-04-26 NOTE — ED Triage Notes (Signed)
CP radiates to the back and left shoulder paini not the same as when had MI in the past

## 2022-04-26 NOTE — Telephone Encounter (Signed)
Inpatient team seeing him in ED. Thanks

## 2022-04-26 NOTE — Discharge Instructions (Addendum)
   You have a Stress Test scheduled at [facility]. Your doctor has ordered this test to check the blood flow in your heart arteries.  Please arrive 15 minutes early for paperwork. The whole test will take several hours. You may want to bring reading material to remain occupied while undergoing different parts of the test.  Instructions: No food/drink after midnight the night before. It is OK to take your morning meds with a sip of water EXCEPT for those types of medicines listed below or otherwise instructed. No caffeine/decaf products 24 hours before, including medicines such as Excedrin or Goody Powders. Call if there are any questions.  Wear comfortable clothes and shoes.   Special Medication Instructions: Beta blockers such as metoprolol (Lopressor/Toprol XL), atenolol (Tenormin), carvedilol (Coreg), nebivolol (Bystolic), bisoprolol (Zebeta), propranolol (Inderal) should not be taken for 24 hours before the test. Calcium channel blockers such as diltiazem (Cardizem) or verapmil (Calan) should not be taken for 24 hours before the test. Remove nitroglycerin patches and do not take nitrate preparations such as Imdur/isosorbide the day of your test. No Persantine/Theophylline or Aggrenox medicines should be used within 24 hours of the test.  If you are diabetic, please ask which medications to hold the day of the test.  What To Expect: When you arrive in the lab, the technician will inject a small amount of radioactive tracer into your arm through an IV while you are resting quietly. This helps [place] to form pictures of your heart. You will likely only feel a sting from the IV. After a waiting period, resting pictures will be obtained under a big camera. These are the "before" pictures.  Next, you will be prepped for the stress portion of the test. This may include either walking on a treadmill or receiving a medicine that helps to dilate blood vessels in your heart to simulate the  effect of exercise on your heart. If you are walking on a treadmill, you will walk at different paces to try to get your heart rate to a goal number that is based on your age. If your doctor has chosen the pharmacologic test, then you will receive a medicine through your IV that may cause temporary nausea, flushing, shortness of breath and sometimes chest discomfort or vomiting. This is typically short-lived and usually resolves quickly. If you experience symptoms, that does not automatically mean the test is abnormal. Some patients do not experience any symptoms at all. Your blood pressure and heart rate will be monitored, and we will be watching your EKG on a computer screen for any changes. During this portion of the test, the radiologist will inject another small amount of radioactive tracer into your IV. After a waiting period, you will undergo a second set of pictures. These are the "after" pictures.  The doctor reading the test will compare the before-and-after images to look for evidence of heart blockages or heart weakness. The test usually takes 1 day to complete, but in certain instances (for example, if a patient is over a certain weight limit), the test may be done over the span of 2 days.

## 2022-04-26 NOTE — Progress Notes (Signed)
Patient seen in the ED with Dr. Burt Knack. Ordered outpatient nuclear stress test. Message sent to [facility] to arrange.   Margie Billet, PA-C 04/26/2022 3:47 PM

## 2022-04-26 NOTE — Telephone Encounter (Signed)
Spoke with patient who stated he is having dull pain 2/10 in his left side, shoulder blade, and back. BP this AM 130/80. At 8:51, took NTG. Pain not better. He called 911 to take him to [facility] ED. Had LAD stent placed in April 2023.

## 2022-04-29 ENCOUNTER — Telehealth (HOSPITAL_COMMUNITY): Payer: Self-pay | Admitting: *Deleted

## 2022-04-29 ENCOUNTER — Telehealth: Payer: Self-pay | Admitting: *Deleted

## 2022-04-29 NOTE — Telephone Encounter (Signed)
-----  Message from Margie Billet, PA-C sent at 04/26/2022  3:49 PM EST ----- Regarding: Outpatient Nuclear STress Test Good Afternoon,   I saw this patient in the ED with Dr. Burt Knack-- recommending outpatient nuclear stress test. I have placed the stress test and consent orders, please arrange study.   Thanks!  Margie Billet, PA-C 04/26/2022 3:50 PM

## 2022-04-29 NOTE — Telephone Encounter (Signed)
Patient given detailed instructions per Myocardial Perfusion Study Information Sheet for the test on 05/01/21 Patient notified to arrive 15 minutes early and that it is imperative to arrive on time for appointment to keep from having the test rescheduled.  If you need to cancel or reschedule your appointment, please call the office within 24 hours of your appointment. . Patient verbalized understanding. Kirstie Peri, RN

## 2022-04-29 NOTE — Telephone Encounter (Signed)
Pts nuclear stress test is scheduled for 05/01/22 at 0745. Pt made aware of appt date and time by [facility] Scheduling team.

## 2022-05-01 ENCOUNTER — Ambulatory Visit (HOSPITAL_COMMUNITY): Payer: BC Managed Care – PPO | Attending: Cardiology

## 2022-05-01 DIAGNOSIS — I2510 Atherosclerotic heart disease of native coronary artery without angina pectoris: Secondary | ICD-10-CM

## 2022-05-01 DIAGNOSIS — R0789 Other chest pain: Secondary | ICD-10-CM | POA: Insufficient documentation

## 2022-05-01 LAB — MYOCARDIAL PERFUSION IMAGING
Base ST Depression (mm): 0 mm
LV dias vol: 90 mL (ref 62–150)
LV sys vol: 37 mL
Nuc Stress EF: 59 %
Peak HR: 91 {beats}/min
Rest HR: 57 {beats}/min
Rest Nuclear Isotope Dose: 10.9 mCi
SDS: 2
SRS: 1
SSS: 3
ST Depression (mm): 0 mm
Stress Nuclear Isotope Dose: 31.6 mCi
TID: 0.95

## 2022-05-01 MED ORDER — TECHNETIUM TC 99M TETROFOSMIN IV KIT
31.6000 | PACK | Freq: Once | INTRAVENOUS | Status: AC | PRN
  Administered 2022-05-01: 31.6 via INTRAVENOUS

## 2022-05-01 MED ORDER — REGADENOSON 0.4 MG/5ML IV SOLN
0.4000 mg | Freq: Once | INTRAVENOUS | Status: AC
  Administered 2022-05-01: 0.4 mg via INTRAVENOUS

## 2022-05-01 MED ORDER — TECHNETIUM TC 99M TETROFOSMIN IV KIT
10.9000 | PACK | Freq: Once | INTRAVENOUS | Status: AC | PRN
  Administered 2022-05-01: 10.9 via INTRAVENOUS

## 2022-05-08 NOTE — Progress Notes (Signed)
Office Visit    Patient Name: Adam Hunter Date of Encounter: 05/10/2022  Primary Care Provider:  Kaleen Mask, MD Primary Cardiologist:  Thurmon Fair, MD  Chief Complaint    62 year old male with a history of CAD with prior MI in 01/2010, s/p DES-p-mLAD (48 mm ) in 07/2021, LV dysfunction, hypertension, hyperlipidemia, type 2 diabetes, OSA, and ED who presents for hospital follow-up related to CAD and chest pain.   Past Medical History    Past Medical History:  Diagnosis Date   CAD (coronary artery disease) 11/25/2012   STEMI October 2011 secondary to occlusion of a large septal branch of the LAD artery treated medically   Emphysema    Heart attack (HCC) 01/2010   non-STEMI   Hyperlipidemia    Hyperlipidemia 11/25/2012   Hypertension    OSA on CPAP    Past Surgical History:  Procedure Laterality Date   CARDIAC CATHETERIZATION  01/20/2010   Diffuse coronary atherosclerosis w/coronary ectasia,totalled first septal perforator. non-obstructive LAD/diagonal,left CX and RCA stenosis   CORONARY ATHERECTOMY N/A 07/19/2021   Procedure: CORONARY ATHERECTOMY;  Surgeon: Lyn Records, MD;  Location: [facility] INVASIVE CV LAB;  Service: Cardiovascular;  Laterality: N/A;   CORONARY STENT INTERVENTION N/A 07/19/2021   Procedure: CORONARY STENT INTERVENTION;  Surgeon: Lyn Records, MD;  Location: [facility] INVASIVE CV LAB;  Service: Cardiovascular;  Laterality: N/A;   FINGER SURGERY  1974   INTRAVASCULAR ULTRASOUND/IVUS N/A 07/19/2021   Procedure: Intravascular Ultrasound/IVUS;  Surgeon: Lyn Records, MD;  Location: [facility] INVASIVE CV LAB;  Service: Cardiovascular;  Laterality: N/A;   LEFT HEART CATH AND CORONARY ANGIOGRAPHY N/A 07/19/2021   Procedure: LEFT HEART CATH AND CORONARY ANGIOGRAPHY;  Surgeon: Lyn Records, MD;  Location: [facility] INVASIVE CV LAB;  Service: Cardiovascular;  Laterality: N/A;   NM MYOCAR PERF WALL MOTION  05/22/2010   normal,EF 53%.   US ECHOCARDIOGRAPHY  01/20/2010   mod. LVH,grade  I diastolic dysfunction,mild AI,MR,MV annular ca+    Allergies  No Known Allergies   Labs/Other Studies Reviewed    The following studies were reviewed today:  Echocardiogram 07/04/2021:  1. Left ventricular ejection fraction, by estimation, is 40 to 45%. Left ventricular ejection fraction by 2D MOD biplane is 42.8 %. The left ventricle has mildly decreased function. The left ventricle demonstrates global hypokinesis. There is mild left ventricular hypertrophy. Left ventricular diastolic parameters are consistent with Grade I diastolic dysfunction (impaired relaxation).   2. Right ventricular systolic function is normal. The right ventricular size is mildly enlarged.   3. Right atrial size was mildly dilated.   4. The mitral valve is grossly normal. Trivial mitral valve  regurgitation.   5. The aortic valve is tricuspid. Aortic valve regurgitation is not visualized.   6. Aortic dilatation noted. There is mild dilatation of the aortic root, measuring 44 mm. There is borderline dilatation of the ascending aorta, measuring 38 mm.   Comparison(s): Prior images unable to be directly viewed, comparison made by report only. Changes from prior study are noted. 01/20/2010: LVEF 50-55%, mild MR.   Cardiac catheterization 07/19/2021    Proximal and mid 99% LAD stenoses within a calcified segment and resultant TIMI grade II flow.  Following orbital atherectomy of both lesions, followed by balloon expansion using a Score flex scoring balloon, a Synergy 3.0 x 48 mm drug-eluting stent was deployed at 14 atm.  The proximal margin of the stent was postdilated at high pressure with a 3.5 mm balloon.  0%  stenosis and TIMI grade III flow was noted. Coronaries otherwise ectatic but without obstructive disease.  The large first diagonal contains 50% obstruction. Mild to moderate hypokinesis is noted in the mid anterior wall.  EF 50% LVEDP 10 mmHg   RECOMMENDATIONS:   Aggressive risk factor modification.  High  intensity statin therapy. Because of the duration of the procedure and contrast used, patient will spend the night and be discharged in a.m.  Lexiscan Myoview 05/01/2022:    The study is normal. The study is low risk.   No ST deviation was noted.  Right bundle branch block noted at baseline.   LV perfusion is normal. There is no evidence of ischemia. There is no evidence of infarction.   Left ventricular function is normal. Nuclear stress EF: 59 %. The left ventricular ejection fraction is normal (55-65%). End diastolic cavity size is normal. End systolic cavity size is normal.   Recent Labs: 04/26/2022: ALT 51; BUN 15; Creatinine, Ser 1.30; Hemoglobin 16.5; Platelets 223; Potassium 4.3; Sodium 136  Recent Lipid Panel    Component Value Date/Time   CHOL 110 10/24/2021 0848   TRIG 261 (H) 10/24/2021 0848   HDL 24 (L) 10/24/2021 0848   CHOLHDL 4.6 10/24/2021 0848   CHOLHDL 5.4 (H) 05/16/2016 1022   VLDL 38 (H) 05/16/2016 1022   LDLCALC 45 10/24/2021 0848    History of Present Illness    62 year old male with the above past medical history including CAD with prior MI in 01/2010, s/p DES-p-mLAD  (48 mm ) in 07/2021, LV dysfunction, hypertension, hyperlipidemia, type 2 diabetes, OSA, and ED.  He was hospitalized in 01/2010 in the setting of NSTEMI due to occlusion of a large septal branch of the LAD artery, managed medically at the time.  He did not have any further coronary events or cardiac complaints until 06/2021 when he complained of progressive angina.  Echocardiogram in 06/2021 showed EF 40 to 45%, mild LVH, G1 DD, normal RV systolic function, mild dilation of the aortic root measuring 44 mm, borderline dilation of ascending aorta measuring 38 mm.  Coronary CT in 07/2021 showed severe CAD with subtotal occlusion of the proximal LAD, coronary calcium score of 1915 (99th percentile).  He underwent cardiac catheterization which revealed proximal and mid LAD 99% stenoses, s/p orbital atherectomy  and DES-p-mid LAD (48 mm).  He was started on aspirin and Plavix.  He was last seen in the office on 10/24/2021 and was stable from a cardiac standpoint.  He presented to the ED on 04/26/2022 with complaints of pain in his left chest and between his shoulder blades x 1 week.  EKG showed sinus rhythm with PVC, known RBBB.  Troponins were low level and flat (27 and 27).  Cardiology was consulted.  His symptoms were overall atypical and thought to be musculoskeletal in nature.  However, given history of long DES, outpatient Lexiscan Myoview was recommended which showed normal LV perfusion, no evidence of ischemia or infarction, low risk.  He presents today for follow-up.  Since his last visit and since his recent ED visit been stable from a cardiac standpoint.  He notes that he flipped his mattress and since this time he has not had any recurrent chest or back discomfort.  He thinks that his symptoms were musculoskeletal in nature.  He denies any other symptoms concerning for angina.  Overall, he reports feeling well.  Home Medications    Current Outpatient Medications  Medication Sig Dispense Refill   Ascorbic Acid (VITAMIN  C) 1000 MG tablet Take 1,000 mg by mouth daily.     aspirin EC 81 MG tablet Take 1 tablet (81 mg total) by mouth daily. 90 tablet 3   atorvastatin (LIPITOR) 80 MG tablet Take 1 tablet (80 mg total) by mouth daily. 90 tablet 3   Cholecalciferol (VITAMIN D) 50 MCG (2000 UT) tablet Take 2,000 Units by mouth daily.     clopidogrel (PLAVIX) 75 MG tablet Take 1 tablet (75 mg total) by mouth daily. Please contact the office to schedule appointment for additional refills. 90 tablet 2   FARXIGA 10 MG TABS tablet TAKE 1 TABLET BY MOUTH DAILY BEFORE BREAKFAST 90 tablet 1   hydrochlorothiazide (HYDRODIURIL) 25 MG tablet Take 0.5 tablets (12.5 mg total) by mouth daily. 45 tablet 3   metFORMIN (GLUCOPHAGE) 500 MG tablet Take 1 tablet (500 mg total) by mouth 2 (two) times daily. 180 tablet 1    metoprolol succinate (TOPROL-XL) 100 MG 24 hr tablet TAKE 1 TABLET BY MOUTH DAILY WITH OR IMMEDIATELY FOLLOWING A MEAL (Patient taking differently: Take 100 mg by mouth daily. WITH OR IMMEDIATELY FOLLOWING A MEAL) 90 tablet 3   nitroGLYCERIN (NITROSTAT) 0.4 MG SL tablet Place 1 tablet (0.4 mg total) under the tongue every 5 (five) minutes as needed. (Patient taking differently: Place 0.4 mg under the tongue every 5 (five) minutes as needed for chest pain.) 25 tablet 12   olmesartan (BENICAR) 20 MG tablet Take 1 tablet (20 mg total) by mouth daily. 90 tablet 3   pantoprazole (PROTONIX) 40 MG tablet Take 1 tablet (40 mg total) by mouth daily. Further refills by PCP. 30 tablet 1   sildenafil (VIAGRA) 50 MG tablet Take 1 tablet (50 mg total) by mouth daily as needed for erectile dysfunction. 10 tablet 3   Zinc 50 MG TABS Take 50 mg by mouth 2 (two) times a week. Wednesdays and Sundays     Current Facility-Administered Medications  Medication Dose Route Frequency Provider Last Rate Last Admin   sodium chloride flush (NS) 0.9 % injection 3 mL  3 mL Intravenous Q12H Croitoru, Mihai, MD         Review of Systems    He denies chest pain, palpitations, dyspnea, pnd, orthopnea, n, v, dizziness, syncope, edema, weight gain, or early satiety. All other systems reviewed and are otherwise negative except as noted above.   Physical Exam    VS:  BP 130/78   Pulse 64   Ht 6' (1.829 m)   Wt 256 lb (116.1 kg)   SpO2 95%   BMI 34.72 kg/m  GEN: Well nourished, well developed, in no acute distress. HEENT: normal. Neck: Supple, no JVD, carotid bruits, or masses. Cardiac: RRR, no murmurs, rubs, or gallops. No clubbing, cyanosis, edema.  Radials/DP/PT 2+ and equal bilaterally.  Respiratory:  Respirations regular and unlabored, clear to auscultation bilaterally. GI: Soft, nontender, nondistended, BS + x 4. MS: no deformity or atrophy. Skin: warm and dry, no rash. Neuro:  Strength and sensation are  intact. Psych: Normal affect.  Accessory Clinical Findings    ECG personally reviewed by me today -NSR, 64 bpm, RBBB- no acute changes.   Lab Results  Component Value Date   WBC 8.4 04/26/2022   HGB 16.5 04/26/2022   HCT 48.7 04/26/2022   MCV 88.9 04/26/2022   PLT 223 04/26/2022   Lab Results  Component Value Date   CREATININE 1.30 (H) 04/26/2022   BUN 15 04/26/2022   NA 136 04/26/2022  K 4.3 04/26/2022   CL 101 04/26/2022   CO2 24 04/26/2022   Lab Results  Component Value Date   ALT 51 (H) 04/26/2022   AST 50 (H) 04/26/2022   ALKPHOS 66 04/26/2022   BILITOT 1.6 (H) 04/26/2022   Lab Results  Component Value Date   CHOL 110 10/24/2021   HDL 24 (L) 10/24/2021   LDLCALC 45 10/24/2021   TRIG 261 (H) 10/24/2021   CHOLHDL 4.6 10/24/2021    Lab Results  Component Value Date   HGBA1C 6.8 (H) 10/24/2021    Assessment & Plan    1. CAD:  S/p prior MI in 01/2010, s/p DES-p-mLAD  (48 mm ) in 07/2021. Lexiscan myoview in 04/2022 showed normal LV perfusion, no evidence of ischemia or infarction, low risk. Stable with no anginal symptoms.  He flipped his mattress and since this time he has not noted any further back or chest discomfort.  He thinks that his symptoms were musculoskeletal in nature.  Discussed ED precautions.  Continue aspirin, Plavix, metoprolol, olmesartan, and Lipitor.  2. LV dysfunction:  Echo in 06/2021 showed EF 40 to 45%, mild LVH, G1 DD, normal RV systolic function, mild dilation of the aortic root measuring 44 mm, borderline dilation of ascending aorta measuring 38 mm.  Lexiscan Myoview in 04/2022 showed normal LV function, EF 59%. Euvolemic and well compensated on exam.  Continue current medications as above.  3. Hypertension: BP well controlled. Continue current antihypertensive regimen.   4. Hyperlipidemia: LDL was 45 in 10/2021.  Continue Lipitor.  5. Type 2 diabetes: A1c was 6.8 in 10/2021.  Monitored and managed per PCP.  6. OSA: Adherent to CPAP.    7. Disposition: Follow-up in 4-6 months with Dr. Sallyanne Kuster.      Lenna Sciara, NP 05/10/2022, 10:44 AM

## 2022-05-10 ENCOUNTER — Encounter: Payer: Self-pay | Admitting: Nurse Practitioner

## 2022-05-10 ENCOUNTER — Ambulatory Visit: Payer: BC Managed Care – PPO | Attending: Nurse Practitioner | Admitting: Nurse Practitioner

## 2022-05-10 VITALS — BP 130/78 | HR 64 | Ht 72.0 in | Wt 256.0 lb

## 2022-05-10 DIAGNOSIS — I519 Heart disease, unspecified: Secondary | ICD-10-CM

## 2022-05-10 DIAGNOSIS — G4733 Obstructive sleep apnea (adult) (pediatric): Secondary | ICD-10-CM

## 2022-05-10 DIAGNOSIS — I2510 Atherosclerotic heart disease of native coronary artery without angina pectoris: Secondary | ICD-10-CM | POA: Diagnosis not present

## 2022-05-10 DIAGNOSIS — E785 Hyperlipidemia, unspecified: Secondary | ICD-10-CM | POA: Diagnosis not present

## 2022-05-10 DIAGNOSIS — I10 Essential (primary) hypertension: Secondary | ICD-10-CM

## 2022-05-10 DIAGNOSIS — E119 Type 2 diabetes mellitus without complications: Secondary | ICD-10-CM

## 2022-05-10 NOTE — Patient Instructions (Signed)
Medication Instructions:  Your physician recommends that you continue on your current medications as directed. Please refer to the Current Medication list given to you today.  *If you need a refill on your cardiac medications before your next appointment, please call your pharmacy*   Lab Work: NONE ordered at this time of appointment   If you have labs (blood work) drawn today and your tests are completely normal, you will receive your results only by: [place] (if you have MyChart) OR A paper copy in the mail If you have any lab test that is abnormal or we need to change your treatment, we will call you to review the results.   Testing/Procedures: NONE ordered at this time of appointment     Follow-Up: At [facility], you and your health needs are our priority.  As part of our continuing mission to provide you with exceptional heart care, we have created designated Provider Care Teams.  These Care Teams include your primary Cardiologist (physician) and Advanced Practice Providers (APPs -  Physician Assistants and Nurse Practitioners) who all work together to provide you with the care you need, when you need it.  We recommend signing up for the patient portal called "MyChart".  Sign up information is provided on this After Visit Summary.  MyChart is used to connect with patients for Virtual Visits (Telemedicine).  Patients are able to view lab/test results, encounter notes, upcoming appointments, etc.  Non-urgent messages can be sent to your provider as well.   To learn more about what you can do with MyChart, go to [email or web address].    Your next appointment:   4-6 month(s)  Provider:   Sanda Klein, MD     Other Instructions

## 2022-06-24 ENCOUNTER — Other Ambulatory Visit: Payer: Self-pay | Admitting: Cardiovascular Disease

## 2022-08-08 DIAGNOSIS — I10 Essential (primary) hypertension: Secondary | ICD-10-CM | POA: Diagnosis not present

## 2022-08-08 DIAGNOSIS — I2510 Atherosclerotic heart disease of native coronary artery without angina pectoris: Secondary | ICD-10-CM | POA: Diagnosis not present

## 2022-08-08 DIAGNOSIS — Z0000 Encounter for general adult medical examination without abnormal findings: Secondary | ICD-10-CM | POA: Diagnosis not present

## 2022-08-08 DIAGNOSIS — E119 Type 2 diabetes mellitus without complications: Secondary | ICD-10-CM | POA: Diagnosis not present

## 2022-08-08 DIAGNOSIS — E785 Hyperlipidemia, unspecified: Secondary | ICD-10-CM | POA: Diagnosis not present

## 2022-08-08 LAB — LAB REPORT - SCANNED
A1c: 8.1
Albumin, Urine POC: 5.1
Creatinine, POC: 101.2 mg/dL
EGFR: 79
Microalb Creat Ratio: 5

## 2022-08-16 ENCOUNTER — Other Ambulatory Visit: Payer: Self-pay | Admitting: Family Medicine

## 2022-08-16 DIAGNOSIS — F17200 Nicotine dependence, unspecified, uncomplicated: Secondary | ICD-10-CM

## 2022-08-19 ENCOUNTER — Other Ambulatory Visit: Payer: Self-pay | Admitting: Cardiovascular Disease

## 2022-08-22 ENCOUNTER — Encounter: Payer: Self-pay | Admitting: Emergency Medicine

## 2022-09-02 ENCOUNTER — Other Ambulatory Visit: Payer: Self-pay | Admitting: Cardiovascular Disease

## 2022-10-23 ENCOUNTER — Ambulatory Visit: Payer: PRIVATE HEALTH INSURANCE | Attending: Cardiovascular Disease | Admitting: Cardiovascular Disease

## 2022-10-23 ENCOUNTER — Encounter: Payer: Self-pay | Admitting: Cardiovascular Disease

## 2022-10-23 VITALS — BP 118/76 | HR 60 | Ht 72.0 in | Wt 251.2 lb

## 2022-10-23 DIAGNOSIS — E119 Type 2 diabetes mellitus without complications: Secondary | ICD-10-CM | POA: Diagnosis not present

## 2022-10-23 DIAGNOSIS — I2510 Atherosclerotic heart disease of native coronary artery without angina pectoris: Secondary | ICD-10-CM | POA: Diagnosis not present

## 2022-10-23 DIAGNOSIS — G4733 Obstructive sleep apnea (adult) (pediatric): Secondary | ICD-10-CM

## 2022-10-23 DIAGNOSIS — E785 Hyperlipidemia, unspecified: Secondary | ICD-10-CM

## 2022-10-23 DIAGNOSIS — I10 Essential (primary) hypertension: Secondary | ICD-10-CM

## 2022-10-23 DIAGNOSIS — Z7984 Long term (current) use of oral hypoglycemic drugs: Secondary | ICD-10-CM

## 2022-10-23 DIAGNOSIS — I5032 Chronic diastolic (congestive) heart failure: Secondary | ICD-10-CM | POA: Diagnosis not present

## 2022-10-23 NOTE — Patient Instructions (Signed)
Medication Instructions:  No changes *If you need a refill on your cardiac medications before your next appointment, please call your pharmacy*   Lab Work: Lipid Panel today If you have labs (blood work) drawn today and your tests are completely normal, you will receive your results only by: MyChart Message (if you have MyChart) OR A paper copy in the mail If you have any lab test that is abnormal or we need to change your treatment, we will call you to review the results.  Follow-Up: At [facility], you and your health needs are our priority.  As part of our continuing mission to provide you with exceptional heart care, we have created designated Provider Care Teams.  These Care Teams include your primary Cardiologist (physician) and Advanced Practice Providers (APPs -  Physician Assistants and Nurse Practitioners) who all work together to provide you with the care you need, when you need it.  We recommend signing up for the patient portal called "MyChart".  Sign up information is provided on this After Visit Summary.  MyChart is used to connect with patients for Virtual Visits (Telemedicine).  Patients are able to view lab/test results, encounter notes, upcoming appointments, etc.  Non-urgent messages can be sent to your provider as well.   To learn more about what you can do with MyChart, go to [email or web address].    Your next appointment:   1 year(s)  Provider:   Mihai Croitoru, MD     

## 2022-10-23 NOTE — Progress Notes (Signed)
Cardiology office note   Evaluation Performed:  Follow-up visit  Date:  10/23/2022   ID:  Adam Hunter, DOB 20-May-1960, MRN [id number]  PCP:  Kaleen Mask, MD  Cardiologist:  Talaysia Pinheiro Electrophysiologist:  None   Chief Complaint:  CAD f/u  History of Present Illness:    Adam Hunter has a history of acute ST segment elevation myocardial infarction October 2011 due to occlusion of a large septal branch of the LAD artery, treated medically, returning with progressive angina in March 2023 and found to have a 99% proximal LAD stenosis with TIMI II flow, treated with placement of a drug-eluting stent (Synergy 3.0 x 48 mm); the first diagonal had a 50% stenosis, whilst his other coronaries appeared ectatic but did not have any other meaningful stenoses.  Note was made of mild-moderate anterior wall hypokinesis with EF 50% but LVEDP was normal at 10 mmHg.  Additional comorbidities include hypertension, dyslipidemia with severely decreased HDL, obstructive sleep apnea on CPAP and morbid obesity (he gained a lot of weight after stopping smoking).  He had a normal nuclear stress test in January 2024.  He is generally doing well.  Can perform intense physical activity without chest discomfort or dyspnea.  Also has sore muscles later in the day.  Has not had any injuries or bleeding problems.  Difficulty losing weight.  Has not had edema, claudication, focal neurological events.  Occasionally has orthostatic lightheadedness, but has not had near-syncope or syncope.  Tries to exercise regularly and eat a healthy diet.  Reports 100% compliance with CPAP and denies daytime hypersomnolence.  Most recent hemoglobin A1c about 2 months ago was elevated at 8.1%.  Last lipid profile showed an excellent LDL cholesterol, well under 55, but he has a chronically low HDL 120s.  Typical systolic blood pressures around 105-110.  Last year his angina was described as retrosternal and radiating through to his back  between his shoulder blades, different from his original presentation in 2011 when he had left shoulder pain).  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  Past Medical History:  Diagnosis Date   CAD (coronary artery disease) 11/25/2012   STEMI October 2011 secondary to occlusion of a large septal branch of the LAD artery treated medically   Emphysema    Heart attack (HCC) 01/2010   non-STEMI   Hyperlipidemia    Hyperlipidemia 11/25/2012   Hypertension    OSA on CPAP    Past Surgical History:  Procedure Laterality Date   CARDIAC CATHETERIZATION  01/20/2010   Diffuse coronary atherosclerosis w/coronary ectasia,totalled first septal perforator. non-obstructive LAD/diagonal,left CX and RCA stenosis   CORONARY ATHERECTOMY N/A 07/19/2021   Procedure: CORONARY ATHERECTOMY;  Surgeon: Lyn Records, MD;  Location: [facility] INVASIVE CV LAB;  Service: Cardiovascular;  Laterality: N/A;   CORONARY STENT INTERVENTION N/A 07/19/2021   Procedure: CORONARY STENT INTERVENTION;  Surgeon: Lyn Records, MD;  Location: [facility] INVASIVE CV LAB;  Service: Cardiovascular;  Laterality: N/A;   CORONARY ULTRASOUND/IVUS N/A 07/19/2021   Procedure: Intravascular Ultrasound/IVUS;  Surgeon: Lyn Records, MD;  Location: [facility] INVASIVE CV LAB;  Service: Cardiovascular;  Laterality: N/A;   FINGER SURGERY  1974   LEFT HEART CATH AND CORONARY ANGIOGRAPHY N/A 07/19/2021   Procedure: LEFT HEART CATH AND CORONARY ANGIOGRAPHY;  Surgeon: Lyn Records, MD;  Location: [facility] INVASIVE CV LAB;  Service: Cardiovascular;  Laterality: N/A;  NM MYOCAR PERF WALL MOTION  05/22/2010   normal,EF 53%.   US ECHOCARDIOGRAPHY  01/20/2010   mod. LVH,grade I diastolic dysfunction,mild AI,MR,MV annular ca+     Current Meds  Medication Sig   Ascorbic Acid (VITAMIN C) 1000 MG tablet Take 1,000 mg by mouth daily.   aspirin EC 81 MG tablet Take 1 tablet (81 mg  total) by mouth daily.   atorvastatin (LIPITOR) 80 MG tablet TAKE 1 TABLET BY MOUTH DAILY   Cholecalciferol (VITAMIN D) 50 MCG (2000 UT) tablet Take 2,000 Units by mouth daily.   clopidogrel (PLAVIX) 75 MG tablet Take 1 tablet (75 mg total) by mouth daily. Please contact the office to schedule appointment for additional refills.   FARXIGA 10 MG TABS tablet TAKE 1 TABLET BY MOUTH DAILY BEFORE BREAKFAST   hydrochlorothiazide (HYDRODIURIL) 25 MG tablet Take 0.5 tablets (12.5 mg total) by mouth daily.   metFORMIN (GLUCOPHAGE) 500 MG tablet Take 1 tablet (500 mg total) by mouth 2 (two) times daily.   metoprolol succinate (TOPROL-XL) 100 MG 24 hr tablet TAKE 1 TABLET BY MOUTH DAILY WITH OR IMMEDIATELY FOLLOWING A MEAL (Patient taking differently: Take 100 mg by mouth daily. WITH OR IMMEDIATELY FOLLOWING A MEAL)   olmesartan (BENICAR) 20 MG tablet TAKE 1 TABLET BY MOUTH DAILY   pantoprazole (PROTONIX) 40 MG tablet Take 1 tablet (40 mg total) by mouth daily. Further refills by PCP.   Zinc 50 MG TABS Take 50 mg by mouth 2 (two) times a week. Wednesdays and Sundays   Current Facility-Administered Medications for the 10/23/22 encounter (Office Visit) with Jocelyne Reinertsen, Rachelle Hora, MD  Medication   sodium chloride flush (NS) 0.9 % injection 3 mL     Allergies:   Patient has no known allergies.   Social History   Tobacco Use   Smoking status: Former    Packs/day: 1.50    Years: 30.00    Additional pack years: 0.00    Total pack years: 45.00    Types: Cigarettes    Quit date: 01/22/2010    Years since quitting: 12.7   Smokeless tobacco: Former  Building services engineer Use: Never used  Substance Use Topics   Alcohol use: Yes    Comment: social   Drug use: No     Family Hx: The patient's family history includes Cancer in his maternal grandfather; Heart attack in his father; Heart disease in his father; Hypertension in his father and mother.  ROS:   Please see the history of present illness.    All  other systems are reviewed and are negative.  Prior CV studies:   The following studies were reviewed today:  Echocardiogram 07/04/2021  1. Left ventricular ejection fraction, by estimation, is 40 to 45%. Left ventricular ejection fraction by 2D MOD biplane is 42.8 %. The left ventricle has mildly decreased function. The left ventricle demonstrates global hypokinesis. There is mild left ventricular hypertrophy. Left ventricular diastolic parameters are consistent with Grade I diastolic dysfunction (impaired relaxation).   2. Right ventricular systolic function is normal. The right ventricular size is mildly enlarged.   3. Right atrial size was mildly dilated.   4. The mitral valve is grossly normal. Trivial mitral valve  regurgitation.   5. The aortic valve is tricuspid. Aortic valve regurgitation is not visualized.   6. Aortic dilatation noted. There is mild dilatation of the aortic root, measuring 44 mm. There is borderline dilatation of the ascending aorta, measuring 38 mm.   Comparison(s): Prior  images unable to be directly viewed, comparison made by report only. Changes from prior study are noted. 01/20/2010: LVEF 50-55%, mild MR.  Labs/Other Tests and Data Reviewed:    Cardiac catheterization 07/19/2021   Diagnostic Dominance: Right Intervention  Implants     Permanent Stent  Synergy Xd 3.0x48     Proximal and mid 99% LAD stenoses within a calcified segment and resultant TIMI grade II flow.  Following orbital atherectomy of both lesions, followed by balloon expansion using a Score flex scoring balloon, a Synergy 3.0 x 48 mm drug-eluting stent was deployed at 14 atm.  The proximal margin of the stent was postdilated at high pressure with a 3.5 mm balloon.  0% stenosis and TIMI grade III flow was noted. Coronaries otherwise ectatic but without obstructive disease.  The large first diagonal contains 50% obstruction. Mild to moderate hypokinesis is noted in the mid anterior wall.  EF  50% LVEDP 10 mmHg   RECOMMENDATIONS:   Aggressive risk factor modification.  High intensity statin therapy. Because of the duration of the procedure and contrast used, patient will spend the night and be discharged in a.m.  EKG: ECG was not ordered today.  ECG from 07/23/2021 shows sinus rhythm and old right bundle branch block, QTc 455 ms  Recent Labs: 04/26/2022: ALT 51; BUN 15; Creatinine, Ser 1.30; Hemoglobin 16.5; Platelets 223; Potassium 4.3; Sodium 136   Recent Lipid Panel Lab Results  Component Value Date/Time   CHOL 110 10/24/2021 08:48 AM   TRIG 261 (H) 10/24/2021 08:48 AM   HDL 24 (L) 10/24/2021 08:48 AM   CHOLHDL 4.6 10/24/2021 08:48 AM   CHOLHDL 5.4 (H) 05/16/2016 10:22 AM   LDLCALC 45 10/24/2021 08:48 AM    Wt Readings from Last 3 Encounters:  10/23/22 251 lb 3.2 oz (113.9 kg)  05/10/22 256 lb (116.1 kg)  05/01/22 250 lb (113.4 kg)     Objective:    Vital Signs:  BP 118/76 (BP Location: Left Arm, Patient Position: Sitting, Cuff Size: Large)   Pulse 60   Ht 6' (1.829 m)   Wt 251 lb 3.2 oz (113.9 kg)   SpO2 92%   BMI 34.07 kg/m     General: Alert, oriented x3, no distress, moderately obese Head: no evidence of trauma, PERRL, EOMI, no exophtalmos or lid lag, no myxedema, no xanthelasma; normal ears, nose and oropharynx Neck: normal jugular venous pulsations and no hepatojugular reflux; brisk carotid pulses without delay and no carotid bruits Chest: clear to auscultation, no signs of consolidation by percussion or palpation, normal fremitus, symmetrical and full respiratory excursions Cardiovascular: normal position and quality of the apical impulse, regular rhythm, normal first and second heart sounds, no murmurs, rubs or gallops Abdomen: no tenderness or distention, no masses by palpation, no abnormal pulsatility or arterial bruits, normal bowel sounds, no hepatosplenomegaly Extremities: no clubbing, cyanosis or edema; 2+ radial, ulnar and brachial pulses  bilaterally; 2+ right femoral, posterior tibial and dorsalis pedis pulses; 2+ left femoral, posterior tibial and dorsalis pedis pulses; no subclavian or femoral bruits Neurological: grossly nonfocal Psych: Normal mood and affect   .   ASSESSMENT & PLAN:    1. Coronary artery disease involving native coronary artery of native heart without angina pectoris   2. Chronic diastolic heart failure (HCC)   3. Essential hypertension   4. Type 2 diabetes mellitus without complication, without long-term current use of insulin (HCC)   5. Dyslipidemia (high LDL; low HDL)   6. Hyperlipidemia LDL goal <50  7. OSA (obstructive sleep apnea)        CAD: Asymptomatic even with intense physical activity.  Due to the long segment of stent that he received would like to continue his clopidogrel at least for 18 months after the procedure, see through the end of 2024.  Continue beta-blocker and high-dose statin. Left ventricular systolic and diastolic dysfunction: He had transient reduction in LV function due to ischemia, with risk every based on EF calculated at 59% at the time of his nuclear scintigram in January 2024. Developed symptoms of heart failure due to proximal LAD artery ischemia.  On Farxiga 10 mg daily.  LVEDP was normal at cath.  He does not require loop diuretics. HTN: If orthostatic dizziness continue to bother him can reduce the hydrochlorothiazide to 12.5 mg every other day. HLP: Target VL less than 55.  Chronically low HDL.  Recheck today. DM: Good control.  Has not had any side effects from [place].  Reminded him to stay very well-hydrated especially in the summer months.  If he needs additional medication he is a great candidate for GLP-1 agonist. OSA: Reports 100% compliance with CPAP, denies daytime hypersomnolence. ED: Has not needed sildenafil recently.  Reminded him PDE 5 inhibitors cannot be combined with nitrates.   Patient Instructions  Medication Instructions:  No  changes *If you need a refill on your cardiac medications before your next appointment, please call your pharmacy*   Lab Work: Lipid Panel- today If you have labs (blood work) drawn today and your tests are completely normal, you will receive your results only by: MyChart Message (if you have MyChart) OR A paper copy in the mail If you have any lab test that is abnormal or we need to change your treatment, we will call you to review the results.   Follow-Up: At [facility], you and your health needs are our priority.  As part of our continuing mission to provide you with exceptional heart care, we have created designated Provider Care Teams.  These Care Teams include your primary Cardiologist (physician) and Advanced Practice Providers (APPs -  Physician Assistants and Nurse Practitioners) who all work together to provide you with the care you need, when you need it.  We recommend signing up for the patient portal called "MyChart".  Sign up information is provided on this After Visit Summary.  MyChart is used to connect with patients for Virtual Visits (Telemedicine).  Patients are able to view lab/test results, encounter notes, upcoming appointments, etc.  Non-urgent messages can be sent to your provider as well.   To learn more about what you can do with MyChart, go to [email or web address].    Your next appointment:   1 year(s)  Provider:   Thurmon Fair, MD       Signed, Thurmon Fair, MD  10/23/2022 8:26 AM    [facility]

## 2022-10-24 LAB — LIPID PANEL
Chol/HDL Ratio: 4.5 ratio (ref 0.0–5.0)
Cholesterol, Total: 117 mg/dL (ref 100–199)
HDL: 26 mg/dL — ABNORMAL LOW (ref >39)
LDL Chol Calc (NIH): 52 mg/dL (ref 0–99)
Triglycerides: 244 mg/dL — ABNORMAL HIGH (ref 0–149)
VLDL Cholesterol Cal: 39 mg/dL (ref 5–40)

## 2022-10-31 ENCOUNTER — Other Ambulatory Visit: Payer: Self-pay | Admitting: Cardiovascular Disease

## 2022-11-04 ENCOUNTER — Other Ambulatory Visit: Payer: Self-pay | Admitting: Cardiovascular Disease

## 2022-11-12 ENCOUNTER — Other Ambulatory Visit: Payer: Self-pay | Admitting: Cardiovascular Disease

## 2022-12-23 ENCOUNTER — Other Ambulatory Visit: Payer: Self-pay | Admitting: Nurse Practitioner

## 2023-05-16 ENCOUNTER — Telehealth: Payer: Self-pay

## 2023-05-16 ENCOUNTER — Other Ambulatory Visit (HOSPITAL_COMMUNITY): Payer: Self-pay

## 2023-05-16 NOTE — Telephone Encounter (Signed)
Pharmacy Patient Advocate Encounter   Received notification from CoverMyMeds that prior authorization for [facility] is required/requested.   Insurance verification completed.   The patient is insured through [employer] [facility] .   Per test claim: PA required; PA submitted to above mentioned insurance via CoverMyMeds Key/confirmation #/EOC [id number] Status is pending

## 2023-05-16 NOTE — Telephone Encounter (Signed)
Pharmacy Patient Advocate Encounter  Received notification from [employer] [facility] that Prior Authorization for Adam Hunter has been APPROVED from 05/15/23 to 05/14/25

## 2023-06-19 ENCOUNTER — Other Ambulatory Visit: Payer: Self-pay | Admitting: Nurse Practitioner

## 2023-08-04 ENCOUNTER — Other Ambulatory Visit: Payer: Self-pay | Admitting: Cardiovascular Disease

## 2023-08-18 ENCOUNTER — Other Ambulatory Visit: Payer: Self-pay | Admitting: Nurse Practitioner

## 2023-08-18 ENCOUNTER — Other Ambulatory Visit: Payer: Self-pay | Admitting: Cardiovascular Disease

## 2023-08-25 ENCOUNTER — Other Ambulatory Visit: Payer: Self-pay | Admitting: Nurse Practitioner

## 2023-09-24 ENCOUNTER — Encounter: Payer: Self-pay | Admitting: Cardiovascular Disease

## 2023-09-29 ENCOUNTER — Other Ambulatory Visit: Payer: Self-pay | Admitting: Nurse Practitioner

## 2023-10-27 ENCOUNTER — Other Ambulatory Visit: Payer: Self-pay | Admitting: Nurse Practitioner

## 2023-10-29 ENCOUNTER — Other Ambulatory Visit: Payer: Self-pay | Admitting: Cardiovascular Disease

## 2023-11-17 ENCOUNTER — Other Ambulatory Visit: Payer: Self-pay | Admitting: Cardiovascular Disease

## 2023-11-17 ENCOUNTER — Other Ambulatory Visit: Payer: Self-pay | Admitting: Nurse Practitioner

## 2023-12-16 ENCOUNTER — Other Ambulatory Visit: Payer: Self-pay | Admitting: Cardiovascular Disease

## 2024-01-12 ENCOUNTER — Encounter: Payer: Self-pay | Admitting: Cardiovascular Disease

## 2024-01-12 ENCOUNTER — Ambulatory Visit: Payer: PRIVATE HEALTH INSURANCE | Attending: Cardiovascular Disease | Admitting: Cardiovascular Disease

## 2024-01-12 VITALS — BP 117/82 | HR 76 | Ht 72.0 in | Wt 245.0 lb

## 2024-01-12 DIAGNOSIS — E785 Hyperlipidemia, unspecified: Secondary | ICD-10-CM | POA: Diagnosis not present

## 2024-01-12 DIAGNOSIS — N522 Drug-induced erectile dysfunction: Secondary | ICD-10-CM

## 2024-01-12 DIAGNOSIS — I2510 Atherosclerotic heart disease of native coronary artery without angina pectoris: Secondary | ICD-10-CM

## 2024-01-12 DIAGNOSIS — I10 Essential (primary) hypertension: Secondary | ICD-10-CM | POA: Diagnosis not present

## 2024-01-12 DIAGNOSIS — I5032 Chronic diastolic (congestive) heart failure: Secondary | ICD-10-CM

## 2024-01-12 DIAGNOSIS — G4733 Obstructive sleep apnea (adult) (pediatric): Secondary | ICD-10-CM

## 2024-01-12 DIAGNOSIS — E119 Type 2 diabetes mellitus without complications: Secondary | ICD-10-CM

## 2024-01-12 NOTE — Progress Notes (Signed)
 Cardiology office note   Evaluation Performed:  Follow-up visit  Date:  01/12/2024   ID:  Adam Hunter, DOB [birth year], MRN [id number]  PCP:  [name], MD  Cardiologist:  Carrington Olazabal Electrophysiologist:  None   Chief Complaint:  CAD f/u  History of Present Illness:    Adam Hunter is a 63 year old gentleman with history of coronary disease, dyslipidemia with very low HDL cholesterol, type 2 diabetes mellitus, obesity, HTN, OSA on CPAP.  He is here for routine follow-up  He presented within acute ST segment elevation myocardial infarction October 2011 due to occlusion of a large septal branch of the LAD artery, treated medically, returning with progressive angina in March 2023 and found to have a 99% proximal LAD stenosis with TIMI II flow, treated with placement of a drug-eluting stent (Synergy 3.0 x 48 mm); the first diagonal had a 50% stenosis, whilst his other coronaries appeared ectatic but did not have any other meaningful stenoses.  Note was made of mild-moderate anterior wall hypokinesis with EF 50% but LVEDP was normal at 10 mmHg. He had a normal nuclear stress test in January 2024, EF59%.   He is doing well and can perform intense physical activity without angina or dyspnea.  He walks 3 miles a day and about 45 minutes.  He is using his CPAP and denies daytime hypersomnolence.  He has not had lower extremity edema or intermittent claudication.  Sildenafil  continues to work for his intermittent claudication.  He has not had any falls or injuries or serious bleeding and is still taking dual antiplatelet therapy with aspirin  and clopidogrel , although it has been more than 2 years since his last revascularization procedure.  Denies focal neurological events.  He has started treatment with Ozempic and has lost quite a bit of weight over the last 5 months.  Most recent metabolic workup showed hemoglobin A1c 7.1% and LDL cholesterol 44, but chronically low HDL at 44 and elevated  triglycerides 266.  He has normal renal function and is TSH is borderline elevated.  Previous angina in 2023 was described as retrosternal and radiating through to his back between his shoulder blades, different from his original presentation in 2011 when he had left shoulder pain).  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  He enjoys his 3 grandkids ages 45, 59 and 3 (1 more on the way), all of whom are involved in sports  Past Medical History:  Diagnosis Date   CAD (coronary artery disease) 11/25/2012   STEMI October 2011 secondary to occlusion of a large septal branch of the LAD artery treated medically   Emphysema    Heart attack (HCC) 01/2010   non-STEMI   Hyperlipidemia    Hyperlipidemia 11/25/2012   Hypertension    OSA on CPAP    Past Surgical History:  Procedure Laterality Date   CARDIAC CATHETERIZATION  01/20/2010   Diffuse coronary atherosclerosis w/coronary ectasia,totalled first septal perforator. non-obstructive LAD/diagonal,left CX and RCA stenosis   CORONARY ATHERECTOMY N/A 07/19/2021   Procedure: CORONARY ATHERECTOMY;  Surgeon: Claudene Victory ORN, MD;  Location: [facility] INVASIVE CV LAB;  Service: Cardiovascular;  Laterality: N/A;   CORONARY STENT INTERVENTION N/A 07/19/2021   Procedure: CORONARY STENT INTERVENTION;  Surgeon: Claudene Victory ORN, MD;  Location: [facility] INVASIVE CV LAB;  Service: Cardiovascular;  Laterality: N/A;   CORONARY ULTRASOUND/IVUS N/A 07/19/2021   Procedure: Intravascular Ultrasound/IVUS;  Surgeon:  Claudene Victory ORN, MD;  Location: [facility] INVASIVE CV LAB;  Service: Cardiovascular;  Laterality: N/A;   FINGER SURGERY  1974   LEFT HEART CATH AND CORONARY ANGIOGRAPHY N/A 07/19/2021   Procedure: LEFT HEART CATH AND CORONARY ANGIOGRAPHY;  Surgeon: Claudene Victory ORN, MD;  Location: [facility] INVASIVE CV LAB;  Service: Cardiovascular;  Laterality: N/A;   NM MYOCAR PERF WALL MOTION  05/22/2010    normal,EF 53%.   US  ECHOCARDIOGRAPHY  01/20/2010   mod. LVH,grade I diastolic dysfunction,mild AI,MR,MV annular ca+     Current Meds  Medication Sig   Ascorbic Acid (VITAMIN C) 1000 MG tablet Take 1,000 mg by mouth daily.   aspirin  EC 81 MG tablet Take 1 tablet (81 mg total) by mouth daily.   atorvastatin  (LIPITOR ) 80 MG tablet TAKE 1 TABLET BY MOUTH DAILY   Cholecalciferol (VITAMIN D) 50 MCG (2000 UT) tablet Take 2,000 Units by mouth daily.   clopidogrel  (PLAVIX ) 75 MG tablet TAKE 1 TABLET BY MOUTH DAILY   dapagliflozin  propanediol (FARXIGA ) 10 MG TABS tablet TAKE 1 TABLET BY MOUTH DAILY BEFORE BREAKFAST   hydrochlorothiazide  (HYDRODIURIL ) 25 MG tablet TAKE 1/2 TABLET BY MOUTH DAILY   metFORMIN  (GLUCOPHAGE ) 500 MG tablet Take 1 tablet (500 mg total) by mouth 2 (two) times daily.   metoprolol  succinate (TOPROL -XL) 100 MG 24 hr tablet TAKE 1 TABLET BY MOUTH DAILY WITH OR IMMEDIATELY FOLLOWING A MEAL   olmesartan  (BENICAR ) 20 MG tablet Take 1 tablet (20 mg total) by mouth daily. Please keep scheduled appointment for future refills. Thank you.   OZEMPIC, 0.25 OR 0.5 MG/DOSE, 2 MG/3ML SOPN Inject 0.5 mg into the skin once a week.   pantoprazole  (PROTONIX ) 40 MG tablet Take 1 tablet (40 mg total) by mouth daily. Further refills by PCP.   Zinc 50 MG TABS Take 50 mg by mouth 2 (two) times a week. Wednesdays and Sundays   Current Facility-Administered Medications for the 01/12/24 encounter (Office Visit) with Nala Kachel, Jerel, MD  Medication   sodium chloride  flush (NS) 0.9 % injection 3 mL     Allergies:   Patient has no known allergies.   Social History   Tobacco Use   Smoking status: Former    Current packs/day: 0.00    Average packs/day: 1.5 packs/day for 30.0 years (45.0 ttl pk-yrs)    Types: Cigarettes    Start date: 01/23/1980    Quit date: 01/22/2010    Years since quitting: 13.9   Smokeless tobacco: Former  Building services engineer status: Never Used  Substance Use Topics   Alcohol  use: Yes    Comment: social   Drug use: No     Family Hx: The patient's family history includes Cancer in his maternal grandfather; Heart attack in his father; Heart disease in his father; Hypertension in his father and mother.  ROS:   Please see the history of present illness.    All other systems are reviewed and are negative.  Prior CV studies:   The following studies were reviewed today:  Echocardiogram 07/04/2021  1. Left ventricular ejection fraction, by estimation, is 40 to 45%. Left ventricular ejection fraction by 2D MOD biplane is 42.8 %. The left ventricle has mildly decreased function. The left ventricle demonstrates global hypokinesis. There is mild left ventricular hypertrophy. Left ventricular diastolic parameters are consistent with Grade I diastolic dysfunction (impaired relaxation).   2. Right ventricular systolic function is normal. The right ventricular size is mildly enlarged.   3. Right atrial  size was mildly dilated.   4. The mitral valve is grossly normal. Trivial mitral valve  regurgitation.   5. The aortic valve is tricuspid. Aortic valve regurgitation is not visualized.   6. Aortic dilatation noted. There is mild dilatation of the aortic root, measuring 44 mm. There is borderline dilatation of the ascending aorta, measuring 38 mm.   Comparison(s): Prior images unable to be directly viewed, comparison made by report only. Changes from prior study are noted. 01/20/2010: LVEF 50-55%, mild MR.  Labs/Other Tests and Data Reviewed:    Cardiac catheterization 07/19/2021   Diagnostic Dominance: Right Intervention  Implants     Permanent Stent  Synergy Xd 3.0x48     Proximal and mid 99% LAD stenoses within a calcified segment and resultant TIMI grade II flow.  Following orbital atherectomy of both lesions, followed by balloon expansion using a Score flex scoring balloon, a Synergy 3.0 x 48 mm drug-eluting stent was deployed at 14 atm.  The proximal margin of  the stent was postdilated at high pressure with a 3.5 mm balloon.  0% stenosis and TIMI grade III flow was noted. Coronaries otherwise ectatic but without obstructive disease.  The large first diagonal contains 50% obstruction. Mild to moderate hypokinesis is noted in the mid anterior wall.  EF 50% LVEDP 10 mmHg     EKG:   EKG Interpretation Date/Time:  Monday January 12 2024 08:31:19 EDT Ventricular Rate:  76 PR Interval:  186 QRS Duration:  142 QT Interval:  430 QTC Calculation: 483 R Axis:   87  Text Interpretation: Normal sinus rhythm Right bundle branch block When compared with ECG of 23-Oct-2022 08:04, Nonspecific T wave abnormality, worse in Anterior leads Confirmed by Gail Vendetti ([id number]) on 01/12/2024 8:42:41 AM         Recent Labs: No results found for requested labs within last 365 days.   Recent Lipid Panel Lab Results  Component Value Date/Time   CHOL 117 10/23/2022 08:25 AM   TRIG 244 (H) 10/23/2022 08:25 AM   HDL 26 (L) 10/23/2022 08:25 AM   CHOLHDL 4.5 10/23/2022 08:25 AM   CHOLHDL 5.4 (H) 05/16/2016 10:22 AM   LDLCALC 52 10/23/2022 08:25 AM    08/10/2023 Cholesterol 109, triglycerides 266, HDL 24, LDL 44  11/14/2023 Hemoglobin A1c 7.1%, TSH 5.5, creatinine 1.1  Wt Readings from Last 3 Encounters:  01/12/24 245 lb (111.1 kg)  10/23/22 251 lb 3.2 oz (113.9 kg)  05/10/22 256 lb (116.1 kg)     Objective:    Vital Signs:  BP 117/82   Pulse 76   Ht 6' (1.829 m)   Wt 245 lb (111.1 kg)   SpO2 95%   BMI 33.23 kg/m     General: Alert, oriented x3, no distress, moderately obese Head: no evidence of trauma, PERRL, EOMI, no exophtalmos or lid lag, no myxedema, no xanthelasma; normal ears, nose and oropharynx Neck: normal jugular venous pulsations and no hepatojugular reflux; brisk carotid pulses without delay and no carotid bruits Chest: clear to auscultation, no signs of consolidation by percussion or palpation, normal fremitus, symmetrical and  full respiratory excursions Cardiovascular: normal position and quality of the apical impulse, regular rhythm, normal first and second heart sounds, no murmurs, rubs or gallops Abdomen: no tenderness or distention, no masses by palpation, no abnormal pulsatility or arterial bruits, normal bowel sounds, no hepatosplenomegaly Extremities: no clubbing, cyanosis or edema; 2+ radial, ulnar and brachial pulses bilaterally; 2+ right femoral, posterior tibial and dorsalis pedis pulses; 2+  left femoral, posterior tibial and dorsalis pedis pulses; no subclavian or femoral bruits Neurological: grossly nonfocal Psych: Normal mood and affect    ASSESSMENT & PLAN:    1. Coronary artery disease involving native coronary artery of native heart without angina pectoris   2. Chronic diastolic heart failure (HCC)   3. Essential hypertension   4. Dyslipidemia (high LDL; low HDL)   5. Type 2 diabetes mellitus without complication, without long-term current use of insulin (HCC)   6. OSA (obstructive sleep apnea)   7. Drug-induced erectile dysfunction        CAD: Asymptomatic.  Well over 2 years since last revascularization.  Will stop the aspirin  but continue clopidogrel  considering the length of stent material in his LAD artery.  Continue beta-blocker and high-dose statin. Left ventricular  diastolic dysfunction: Asymptomatic.  Recovered LVEF he had transient reduction in LV function due to ischemia, withrecovery based on EF calculated at 59% at the time of his nuclear scintigram in January 2024. Developed symptoms of heart failure due to proximal LAD artery ischemia.  On Farxiga  10 mg daily.  LVEDP was normal at cath.  He does not require loop diuretics. HTN: When he tried stopping the hydrochlorothiazide  altogether he developed elevated blood pressure again, but he had orthostatic hypotension when he was taking the medication daily.  Currently appears to be well-balanced on hydrochlorothiazide  12.5 mg every  other day in addition to the olmesartan  and metoprolol  succinate. HLP: LDL is at target, less than 55.  Chronically low HDL and mildly elevated triglycerides consistent with insulin resistance/metabolic syndrome DM: Fair control.  He has not had any infectious side effects from the Farxiga  and is starting to lose additional weight on Ozempic.  OSA: Reports 100% compliance with CPAP, denies daytime hypersomnolence. ED: Good response to sildenafil .  Knows about the interaction with nitrates.   Patient Instructions  Medication Instructions:   Your physician recommends that you continue on your current medications as directed. Please refer to the Current Medication list given to you today.   *If you need a refill on your cardiac medications before your next appointment, please call your pharmacy*  Lab Work:  NONE ORDERED  TODAY   If you have labs (blood work) drawn today and your tests are completely normal, you will receive your results only by: MyChart Message (if you have MyChart) OR A paper copy in the mail If you have any lab test that is abnormal or we need to change your treatment, we will call you to review the results.  Testing/Procedures:  NONE ORDERED  TODAY    Follow-Up: At [facility], you and your health needs are our priority.  As part of our continuing mission to provide you with exceptional heart care, our providers are all part of one team.  This team includes your primary Cardiologist (physician) and Advanced Practice Providers or APPs (Physician Assistants and Nurse Practitioners) who all work together to provide you with the care you need, when you need it.  Your next appointment:   1 year(s)   Provider:   Jerel Balding, MD     We recommend signing up for the patient portal called MyChart.  Sign up information is provided on this After Visit Summary.  MyChart is used to connect with patients for Virtual Visits (Telemedicine).  Patients are able to view  lab/test results, encounter notes, upcoming appointments, etc.  Non-urgent messages can be sent to your provider as well.   To learn more about what  you can do with MyChart, go to [email or web address].   Other Instructions            Signed, Jerel Balding, MD  01/12/2024 3:37 PM     [facility]

## 2024-01-12 NOTE — Patient Instructions (Addendum)
 Medication Instructions:  Stop aspirin  Your physician recommends that you continue on your current medications as directed. Please refer to the Current Medication list given to you today.   *If you need a refill on your cardiac medications before your next appointment, please call your pharmacy*  Lab Work:  NONE ORDERED  TODAY   If you have labs (blood work) drawn today and your tests are completely normal, you will receive your results only by: MyChart Message (if you have MyChart) OR A paper copy in the mail If you have any lab test that is abnormal or we need to change your treatment, we will call you to review the results.  Testing/Procedures:  NONE ORDERED  TODAY    Follow-Up: At [facility], you and your health needs are our priority.  As part of our continuing mission to provide you with exceptional heart care, our providers are all part of one team.  This team includes your primary Cardiologist (physician) and Advanced Practice Providers or APPs (Physician Assistants and Nurse Practitioners) who all work together to provide you with the care you need, when you need it.  Your next appointment:   1 year(s)   Provider:   Jerel Balding, MD     We recommend signing up for the patient portal called MyChart.  Sign up information is provided on this After Visit Summary.  MyChart is used to connect with patients for Virtual Visits (Telemedicine).  Patients are able to view lab/test results, encounter notes, upcoming appointments, etc.  Non-urgent messages can be sent to your provider as well.   To learn more about what you can do with MyChart, go to [email or web address].   Other Instructions

## 2024-01-19 ENCOUNTER — Other Ambulatory Visit: Payer: Self-pay | Admitting: Nurse Practitioner

## 2024-01-26 ENCOUNTER — Other Ambulatory Visit: Payer: Self-pay | Admitting: Cardiovascular Disease

## 2024-02-02 ENCOUNTER — Other Ambulatory Visit: Payer: Self-pay | Admitting: Cardiovascular Disease

## 2024-03-15 ENCOUNTER — Telehealth: Payer: Self-pay | Admitting: Cardiovascular Disease

## 2024-03-18 NOTE — Telephone Encounter (Signed)
*  STAT* If patient is at the pharmacy, call can be transferred to refill team.   1. Which medications need to be refilled? (please list name of each medication and dose if known)   atorvastatin  (LIPITOR ) 80 MG tablet  FARXIGA  10 MG TABS tablet   2. Would you like to learn more about the convenience, safety, & potential cost savings by using the [facility]?   3. Are you open to using the [facility] (Type [facility]. ).  4. Which pharmacy/location (including street and city if local pharmacy) is medication to be sent to?  [facility] - [place], [place] - [address]   5. Do they need a 30 day or 90 day supply?   90 day  Patient stated he has a couple of days left of these medication.

## 2024-03-18 NOTE — Telephone Encounter (Signed)
 Refills sent in

## 2024-05-03 ENCOUNTER — Telehealth: Payer: Self-pay | Admitting: Cardiovascular Disease

## 2024-05-05 NOTE — Telephone Encounter (Signed)
" °*  STAT* If patient is at the pharmacy, call can be transferred to refill team.   1. Which medications need to be refilled? (please list name of each medication and dose if known) clopidogrel  (PLAVIX ) 75 MG tablet    2. Would you like to learn more about the convenience, safety, & potential cost savings by using the [facility]?    3. Are you open to using the [facility] (Type [facility].    4. Which pharmacy/location (including street and city if local pharmacy) is medication to be sent to?  [facility] - [place], [place] - [address]     5. Do they need a 30 day or 90 day supply? 90  "

## 2024-05-06 NOTE — Telephone Encounter (Signed)
 Please see below. Patient is asking that it be sent in asap. Please advise

## 2024-05-06 NOTE — Telephone Encounter (Signed)
 Pt's medication was sent to pt's pharmacy as requested. Confirmation received.
